# Patient Record
Sex: Female | Born: 1990
Health system: Southern US, Community
[De-identification: ages and names within clinical notes are randomized; demographics above are authoritative.]

## PROBLEM LIST (undated history)

## (undated) ENCOUNTER — Inpatient Hospital Stay (HOSPITAL_COMMUNITY): Payer: Self-pay

## (undated) DIAGNOSIS — Z789 Other specified health status: Secondary | ICD-10-CM

## (undated) HISTORY — PX: NO PAST SURGERIES: SHX2092

---
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
## 2010-04-09 NOTE — Assessment & Plan Note (Signed)
Summary: initial post-partum check   Vital Signs:  Patient profile:   20 year old female Height:      62.5 inches Weight:      189.44 pounds BMI:     34.22 Temp:     97.8 degrees F oral Pulse rate:   73 / minute BP sitting:   112 / 74  (left arm)  Vitals Entered By: Terese Door (Jul 22, 2009 1:49 PM) CC: Postpartum Is Patient Diabetic? No Pain Assessment Patient in pain? yes     Location: stomach/back Intensity: 3 Type: crampy/sharp   Primary Care Provider:  Marisue Ivan  MD  CC:  Postpartum.  History of Present Illness: 20yo F G1P1001 here for post-partum visit  Post-partum visit: s/p post-date induction.  Vaginal delivery on 05/27/2009 to a healthy female without any complications.  Currently breast/bottle feeding.  Not on any birth control at this time.  No sexual intercourse since the delivery.  Interested in the implanon but would like to read more about it prior to deciding.  Denies any pelvic/abd pain, vaginal bleeding, or depression.  Currently on PNVs and Fe supplementation.    Habits & Providers  Alcohol-Tobacco-Diet     Tobacco Status: never  Current Medications (verified): 1)  Prenavite Multiple Vitamin 28-0.8 Mg Tabs (Prenatal Vit-Fe Fumarate-Fa) .... One Tablet By Mouth Daily 2)  Ferrous Sulfate 325 (65 Fe) Mg Tabs (Ferrous Sulfate) .... One Tablet By Mouth Two Times A Day For Anemia 3)  Ortho Micronor 0.35 Mg Tabs (Norethindrone) .Marland Kitchen.. 1 Tablet By Mouth Daily At The Same Time  Allergies (verified): 1)  ! Amoxicillin  Past History:  Past Medical History: G1P1001 Obesity  Review of Systems      See HPI  Physical Exam  General:  Vital signs reviewed Obese patient in NAD.  Awake, cooperative.  Lungs:  Normal respiratory effort, chest expands symmetrically. Lungs are clear to auscultation, no crackles or wheezes. Heart:  Normal rate and regular rhythm. S1 and S2 normal without gallop, murmur, click, rub or other extra sounds. Abdomen:   Soft, NT, ND, no HSM, active BS  Extremities:  no edema Neurologic:  no focal deficits Psych:  no signs or symptoms of depression   Impression & Recommendations:  Problem # 1:  POSTPARTUM EXAMINATION, NORMAL (ICD-V24.2) Assessment New  G1P1001 s/p post date induction on 05/27/09. Doing well.  No signs of depression.  No medical complications. Breast/bottle feeding. Cont PNVs. Will start on Micronor while she reads over the information packet on implanon and lets [place] know if she decides she wants it. Will make a f/u appt for insertion accordingly.  Orders: Postpartum visit- [facility] [phone])  Problem # 2:  ANEMIA (ICD-285.9) Assessment: Unchanged Will check a CBC w/ diff to assess. Continue Fe supplementation.  Her updated medication list for this problem includes:    Ferrous Sulfate 325 (65 Fe) Mg Tabs (Ferrous sulfate) ..... One tablet by mouth two times a day for anemia  Orders: CBC w/Diff-[facility] ([id number])  Complete Medication List: 1)  Prenavite Multiple Vitamin 28-0.8 Mg Tabs (Prenatal vit-fe fumarate-fa) .... One tablet by mouth daily 2)  Ferrous Sulfate 325 (65 Fe) Mg Tabs (Ferrous sulfate) .... One tablet by mouth two times a day for anemia 3)  Ortho Micronor 0.35 Mg Tabs (Norethindrone) .Marland Kitchen.. 1 tablet by mouth daily at the same time  Patient Instructions: 1)  Go home and read all the information about implanon and call us to let me know if you want it and we'll  set you up with an appt once we order it. 2)  Go ahead and start the birth control pill today. Prescriptions: ORTHO MICRONOR 0.35 MG TABS (NORETHINDRONE) 1 tablet by mouth daily at the same time  #30 x 1   Entered and Authorized by:   Marisue Ivan  MD   Signed by:   Marisue Ivan  MD on 07/22/2009   Method used:   Electronically to        [facility] [id number] (retail)       [address] [place], [place]  [address]       Ph: [phone]       Fax: [phone]   RxID:    [id number]

## 2010-04-09 NOTE — Letter (Signed)
Summary: [employer] [facility]   Imported By: Clydell Hakim 04/30/2009 15:28:52  _____________________________________________________________________  External Attachment:    Type:   Image     Comment:   External Document  Appended Document: [employer] [facility] Reviewed and completed.

## 2010-04-09 NOTE — Assessment & Plan Note (Signed)
Summary: 32 1/7wk ob   Vital Signs:  Patient profile:   20 year old female Weight:      196.6 pounds BMI:     35.51 Temp:     97.7 degrees F oral Pulse rate:   90 / minute Pulse rhythm:   regular BP sitting:   124 / 68 Cuff size:   regular  Vitals Entered By: Loralee Pacas CMA (March 27, 2009 2:22 PM)  Habits & Providers  Alcohol-Tobacco-Diet     Cigarette Packs/Day: n/a  Current Medications (verified): 1)  Prenavite Multiple Vitamin 28-0.8 Mg Tabs (Prenatal Vit-Fe Fumarate-Fa) .... One Tablet By Mouth Daily 2)  Ferrous Sulfate 325 (65 Fe) Mg Tabs (Ferrous Sulfate) .... One Tablet By Mouth Two Times A Day For Anemia  Allergies (verified): 1)  ! Amoxicillin  Past History:  Past medical, surgical, family and social histories (including risk factors) reviewed, and no changes noted (except as noted below).  Past Medical History: Reviewed history from 12/05/2008 and no changes required. none  Past Surgical History: Reviewed history from 12/05/2008 and no changes required. none  Family History: Reviewed history from 12/05/2008 and no changes required. Mom- DM Dad  Social History: Reviewed history from 01/24/2009 and no changes required. Lives with mom in [place]. Student- 12th grade at [employer] No tobacco No EtOH No drug use Best Contact # [phone] (cell)  Review of Systems       no fevers, abd pain, or vag bleeding  Physical Exam  General:  VS Reviewed. Pregnant, well appearing, NAD. FOB present.  Lungs:  Normal respiratory effort, chest expands symmetrically. Lungs are clear to auscultation, no crackles or wheezes. Heart:  Normal rate and regular rhythm. S1 and S2 normal without gallop, murmur, click, rub or other extra sounds. Abdomen:  Gravid, nontender, appropriate fundal ht for gest age Extremities:  no edema Neurologic:  no focal deficits Psych:  no signs of depression   Impression & Recommendations:  Problem # 1:  PREGNANT STATE, INCIDENTAL  (ICD-V22.2) Assessment Unchanged 19yo G1P0 at 32 1/7 wks per U/S (9/28) EDD: 05/21/2009 (9/30): H/H 9.7/30.3, Hep B neg, RPR NR, Rub Immune, B+ Ab neg, Rh pos, Sickle Cell neg, HIV NR, GC/Chl- neg/neg, Ur Cx- 25K multiple morphotypes, repeat Ur cx- neg (12/22): H/H 9.7/30.7, MCV 81.9, HIV- NR, RPR- NR, 1hr Gluc 101  Fundal ht wnls.  FHTs 140s.  Discussed results of 1 hour glucola, HIV, RPR, and CBC today.  Preterm labor precaution and kick counts discussed.  Plan for f/u in 2 weeks with Dr. Denyse Amass (1st call for delivery).   Orders: Medicaid OB visit - [facility] ([id number])  Problem # 2:  ANEMIA (ICD-285.9) Assessment: Unchanged Pt currently taking PNVs and FeSO4 325mg  two times a day.   Her updated medication list for this problem includes:    Ferrous Sulfate 325 (65 Fe) Mg Tabs (Ferrous sulfate) ..... One tablet by mouth two times a day for anemia  Complete Medication List: 1)  Prenavite Multiple Vitamin 28-0.8 Mg Tabs (Prenatal vit-fe fumarate-fa) .... One tablet by mouth daily 2)  Ferrous Sulfate 325 (65 Fe) Mg Tabs (Ferrous sulfate) .... One tablet by mouth two times a day for anemia  Patient Instructions: 1)  Please schedule a follow-up appointment in 2 weeks with Dr. Denyse Amass. 2)   Go to [facility] if you have vaginal bleeding, rupture of membranes, painful contractions that are regular and occur every 4-5 minutes, or if you have any trauma to your abdomen. 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    Number of children at home: 0    Hospital of delivery: [facility]  FOB Information    Husband/Father of baby: Rae Roam    FOB occupation Student    Phone: [phone]  Menstrual History    Menarche: 8 years    Menses interval: 28 days    Menstrual flow 4 days    On BCP's at conception:  no   Flowsheet View for Follow-up Visit    Estimated weeks of       gestation:     32 1/7    Weight:     196.6    Blood pressure:   124 / 68    Headache:     No    Nausea/vomiting:   No    Edema:     0    Vaginal bleeding:   no    Vaginal discharge:   no    Fundal height:      32.5    FHR:       140s    Fetal activity:     yes    Labor symptoms:   no    Taking prenatal vits?   Y    Smoking:     n/a    Next visit:     2 wk    Resident:     Burnadette Pop    Preceptor:     Mauricio Po

## 2010-04-09 NOTE — Progress Notes (Signed)
Summary: triage   Phone Note Call from Patient Call back at [phone]   Caller: Patient Summary of Call: Pt has been on depo May and is bleeding is this normal. Initial call taken by: Clydell Hakim,  August 19, 2009 11:35 AM  Follow-up for Phone Call        "that mailbox is full" Follow-up by: Golden Circle RN,  August 19, 2009 11:54 AM  Additional Follow-up for Phone Call Additional follow up Details #1::        got it 07/30/09. told her bleeding can be normal for some women. give it another week or 2. if it has not tapered off, will need to see md. she agreed with plan Additional Follow-up by: Golden Circle RN,  August 19, 2009 11:56 AM    Additional Follow-up for Phone Call Additional follow up Details #2::    agree with Kennon Rounds Follow-up by: Marisue Ivan  MD,  August 19, 2009 1:39 PM

## 2010-04-09 NOTE — Progress Notes (Signed)
Summary: thigh cramps bilateral- acute onset   Phone Note Call from Patient   Caller: Mom Summary of Call: Legs cramps that began yesterdays.  Localized to thighs bilaterally.  No swelling or redness.  Pt is eating and drinking.  No new medications.  No abd pain.  Advised to elevate legs if edema is present.  Take tylenol 650mg  for pain and f/u at the [facility] in the morning if symptoms still present. Initial call taken by: Marisue Ivan  MD,  May 06, 2009 9:19 PM

## 2010-04-09 NOTE — Progress Notes (Signed)
Summary: phn msg   Phone Note Call from Patient Call back at [phone]   Caller: Patient Summary of Call: Pt's want to be on homebound for school.  Said her school councelor had faxed papers to [place] to be filled out.   Initial call taken by: Clydell Hakim,  April 09, 2009 9:08 AM  Follow-up for Phone Call        I have yet to receive the paperwork.  I tried calling the patient and left a message regarding this and advised her to contact the school counselor to fax the appropriate paperwork. Follow-up by: Marisue Ivan  MD,  April 14, 2009 3:52 PM

## 2010-04-09 NOTE — Assessment & Plan Note (Signed)
Summary: OB/KH   Vital Signs:  Patient profile:   20 year old female Weight:      209.9 pounds Temp:     97.7 degrees F oral Pulse rate:   79 / minute Pulse rhythm:   regular BP sitting:   119 / 76  (right arm) Cuff size:   large  Vitals Entered By: Loralee Pacas CMA (May 08, 2009 4:06 PM)  Habits & Providers  Alcohol-Tobacco-Diet     Cigarette Packs/Day: n/a  Current Problems (verified): 1)  Anemia  (ICD-285.9) 2)  Overweight  (ICD-278.02) 3)  Pregnant State, Incidental  (ICD-V22.2)  Current Medications (verified): 1)  Prenavite Multiple Vitamin 28-0.8 Mg Tabs (Prenatal Vit-Fe Fumarate-Fa) .... One Tablet By Mouth Daily 2)  Ferrous Sulfate 325 (65 Fe) Mg Tabs (Ferrous Sulfate) .... One Tablet By Mouth Two Times A Day For Anemia  Allergies (verified): 1)  ! Amoxicillin  Past History:  Past medical, surgical, family and social histories (including risk factors) reviewed, and no changes noted (except as noted below).  Past Medical History: Reviewed history from 12/05/2008 and no changes required. none  Past Surgical History: Reviewed history from 12/05/2008 and no changes required. none  Family History: Reviewed history from 12/05/2008 and no changes required. Mom- DM Dad  Social History: Reviewed history from 01/24/2009 and no changes required. Lives with mom in [place]. Student- 12th grade at [employer] No tobacco No EtOH No drug use Best Contact # [phone] (cell)  Review of Systems       No headaches, contractions, abd pain.  No pelvic pain.  No N/V/change in bowel habits.  No edema.  No changes in vision.  No chest pain or dyspnea.  No rash or itching  Physical Exam  General:  Vital signs reviewed Well-developed, well-nourished patient in NAD.  Awake, cooperative. Mouth:  oral mucosa moist Abdomen:  gravid/fundal height and FHTs appropriate for GA Pulses:  distal pulses palpable bilateral lower extremities Extremities:  no edema noted Neurologic:  no  clonus or hyperreflexia.  Sensation intact   Impression & Recommendations:  Problem # 1:  PREGNANT STATE, INCIDENTAL (ICD-V22.2) 19yo G1P0 at 38.[redacted] wks GA per U/S (9/28) EDD: 05/21/2009 Expecting a girl (name: Na'Kiya) (9/30): H/H 9.7/30.3, Hep B neg, RPR NR, Rub Immune, B+ Ab neg, Rh pos, Sickle Cell neg, HIV NR, GC/Chl- neg/neg, Ur Cx- 25K multiple morphotypes, repeat Ur cx- neg (12/22): H/H 9.7/30.7, MCV 81.9, HIV- NR, RPR- NR, 1hr Gluc 101 GBS neg, GC/Chlam neg.   Bedside U/S conveys vertex position.  Fundal ht wnls.  FHTs 140s. Plans on breastfeeding and initial OCPs then Implanonn.  Preterm labor precaution and kick counts discussed.  Plan for f/u in 1 week.  Orders: [facility]- Est Level  3 ([id number])  Complete Medication List: 1)  Prenavite Multiple Vitamin 28-0.8 Mg Tabs (Prenatal vit-fe fumarate-fa) .... One tablet by mouth daily 2)  Ferrous Sulfate 325 (65 Fe) Mg Tabs (Ferrous sulfate) .... One tablet by mouth two times a day for anemia  Patient Instructions: 1)  1)  Schedule a follow up appt with me in 1 week for OB check up. 2)  2)   Go to [facility] if you have vaginal bleeding, rupture of membranes, painful contractions that are regular and occur every 4-5 minutes, or if you have any trauma to your abdomen. 3)  3)  Do daily kick counts.     Flowsheet View for Follow-up Visit    Estimated weeks of  gestation:     38 1/7    Weight:     209.9    Blood pressure:   119 / 76    Hx headache?     No    Nausea/vomiting?   No    Edema?     0    Bleeding?     no    Leakage/discharge?   no    Fetal activity:       yes    Labor symptoms?   no    Fundal height:      37    FHR:       130s    Fetal position:      vertex    Taking Vitamins?   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

## 2010-04-09 NOTE — Assessment & Plan Note (Signed)
Summary: OB/KH   Vital Signs:  Patient profile:   20 year old female Weight:      217 pounds BP sitting:   125 / 75  Vitals Entered By: Arlyss Repress CMA, (May 23, 2009 2:28 PM)   CC:  FU OB.  Habits & Providers  Alcohol-Tobacco-Diet     Cigarette Packs/Day: n/a  Current Problems (verified): 1)  Anemia  (ICD-285.9) 2)  Overweight  (ICD-278.02) 3)  Pregnant State, Incidental  (ICD-V22.2)  Current Medications (verified): 1)  Prenavite Multiple Vitamin 28-0.8 Mg Tabs (Prenatal Vit-Fe Fumarate-Fa) .... One Tablet By Mouth Daily 2)  Ferrous Sulfate 325 (65 Fe) Mg Tabs (Ferrous Sulfate) .... One Tablet By Mouth Two Times A Day For Anemia  Allergies (verified): 1)  ! Amoxicillin  Review of Systems       no headaches, vision changes, RUQ pain, nausea, vomiting, change in bowel habits.  Mild lower extremity edema.  No chest pain or shortness of breath.    Physical Exam  General:  Vital signs reviewed Well-developed, well-nourished patient in NAD.  Awake, cooperative.  Lungs:  clear to auscultation without crackles or wheezing Heart:  RRR without murmur noted Abdomen:  gravid, fundal height decreased most likely due to fetus descending into pelvis.  FHR reassuring Pulses:  distal LE pulses palpable Extremities:  trace LE edema Neurologic:  DTRs +2   Impression & Recommendations:  Problem # 1:  PREGNANT STATE, INCIDENTAL (ICD-V22.2) 19yo G1P0 at 40.[redacted] wks GA per U/S (9/28) EDD: 05/21/2009 Expecting a girl (name: Na'Kiya) (9/30): H/H 9.7/30.3, Hep B neg, RPR NR, Rub Immune, B+ Ab neg, Rh pos, Sickle Cell neg, HIV NR, GC/Chl- neg/neg, Ur Cx- 25K multiple morphotypes, repeat Ur cx- neg (12/22): H/H 9.7/30.7, MCV 81.9, HIV- NR, RPR- NR, 1hr Gluc 101 GBS neg, GC/Chlam neg.   Vertex per palpation  Scheduled patient for induction next week.  Also scheduled for NST/BPP before induction.  Otherwise patient doing very well.    Orders: [facility]- Est Level  3 (99213) Medicaid OB  visit - [facility] ([id number])  Problem # 2:  ANEMIA (ICD-285.9)  Her updated medication list for this problem includes:    Ferrous Sulfate 325 (65 Fe) Mg Tabs (Ferrous sulfate) ..... One tablet by mouth two times a day for anemia  Complete Medication List: 1)  Prenavite Multiple Vitamin 28-0.8 Mg Tabs (Prenatal vit-fe fumarate-fa) .... One tablet by mouth daily 2)  Ferrous Sulfate 325 (65 Fe) Mg Tabs (Ferrous sulfate) .... One tablet by mouth two times a day for anemia  Patient Instructions: 1)  Your induction is scheduled next Wed March 23 at 7:30 AM.  Go to the main entrance at [facility] and they will take you to the Labor and Delivery [address]. 2)  You will need a BPP and NST before induction.  We are going to schedule that for you before you leave today.   3)  If you start feeling any contractions every 5-10 minutes for an hour, go to [facility]. 4)  If you have any loss of fluid, any vaginal bleeding, or not feeling the baby move 5 times in an hour, go to [facility]   5)  Tell them to page me when you get there if you go in early.  My pager is on all the time.     OB Initial Intake Information    Positive HCG by: [facility]    Race: Black    Marital status: Single    Occupation: Consulting civil engineer  Education (last grade completed): currently in 12th grade    Number of children at home: 0    [facility] delivery[facility]  FOB Information    Husband/Father of baby: Rae Roam    FOB occupation Student    Phone: [phone]  Menstrual History    Menarche: 8 years    Menses interval: 28 days    Menstrual flow 4 days    On BCP's at conception: no   Flowsheet View for Follow-up Visit    Estimated weeks of       gestation:     40 2/7    Weight:     217    Blood pressure:   125 / 75    Headache:     No    Nausea/vomiting:   No    Edema:     TrLE    Vaginal bleeding:   no    Vaginal discharge:   no    Fundal height:      39    FHR:       150s    Fetal  activity:     yes    Labor symptoms:   no    Fetal position:     vertex    Cx Dilation:     1    Cx Effacement:   20%    Cx Station:     high    Taking prenatal vits?   Y    Smoking:     n/a    Resident:     Gwendolyn Grant    Preceptor:     McDiarmid    Comment:     scheduled for induction    Flowsheet View for Follow-up Visit    Estimated weeks of       gestation:     40 2/7    Weight:     217    Blood pressure:   125 / 75    Hx headache?     No    Nausea/vomiting?   No    Edema?     TrLE    Bleeding?     no    Leakage/discharge?   no    Fetal activity:       yes    Labor symptoms?   no    Fundal height:      39    FHR:       150s    Fetal position:      vertex    Cx dilation:     1    Cx effacement:   20%    Fetal station:     high    Taking Vitamins?   Y    Smoking PPD:   n/a    Comment:     scheduled for induction    Resident:     Gwendolyn Grant    Preceptor:     McDiarmid   Appended Document: OB/KH NST/BPP scheduled at [facility] 05/27/09 @ 12:30pm

## 2010-04-09 NOTE — Progress Notes (Signed)
Summary: paperwork   Phone Note Call from Patient Call back at [phone]   Caller: Patient Summary of Call: wants to know about paperwork that was dropped off - about homeschool Initial call taken by: De Nurse,  April 21, 2009 10:39 AM  Follow-up for Phone Call        Called patient back to send the paperwork.  I have checked my box and do not see any forms. Follow-up by: Marisue Ivan  MD,  April 21, 2009 4:31 PM

## 2010-04-09 NOTE — Assessment & Plan Note (Signed)
Summary: 33 6/7 wk OB   Vital Signs:  Patient profile:   20 year old female Height:      62.5 inches Weight:      200.5 pounds BMI:     36.22 Pulse rate:   90 / minute BP sitting:   119 / 82  (left arm) Cuff size:   regular  Vitals Entered By: Gladstone Pih (April 08, 2009 2:03 PM) CC: OB 34 wks Is Patient Diabetic? No Pain Assessment Patient in pain? no        CC:  OB 34 wks.  Habits & Providers  Alcohol-Tobacco-Diet     Tobacco Status: never     Cigarette Packs/Day: n/a  Current Medications (verified): 1)  Prenavite Multiple Vitamin 28-0.8 Mg Tabs (Prenatal Vit-Fe Fumarate-Fa) .... One Tablet By Mouth Daily 2)  Ferrous Sulfate 325 (65 Fe) Mg Tabs (Ferrous Sulfate) .... One Tablet By Mouth Two Times A Day For Anemia  Allergies (verified): 1)  ! Amoxicillin  Past History:  Past medical, surgical, family and social histories (including risk factors) reviewed, and no changes noted (except as noted below).  Past Medical History: Reviewed history from 12/05/2008 and no changes required. none  Past Surgical History: Reviewed history from 12/05/2008 and no changes required. none  Family History: Reviewed history from 12/05/2008 and no changes required. Mom- DM Dad  Social History: Reviewed history from 01/24/2009 and no changes required. Lives with mom in [place]. Student- 12th grade at [employer] No tobacco No EtOH No drug use Best Contact # [phone] (cell)  Review of Systems       no fevers, vag bleeding, vag discharge  Physical Exam  General:  VS Reviewed. Well appearing pregnant AAF, NAD.  Abdomen:  soft, NT, appropriate fundal ht for gest age Extremities:  no edema   Impression & Recommendations:  Problem # 1:  PREGNANT STATE, INCIDENTAL (ICD-V22.2) Assessment Unchanged  19yo G1P0 at 33 6/7 wks per U/S (9/28) EDD: 05/21/2009 Expecting a girl (name: Na'Kiya) (9/30): H/H 9.7/30.3, Hep B neg, RPR NR, Rub Immune, B+ Ab neg, Rh pos, Sickle Cell  neg, HIV NR, GC/Chl- neg/neg, Ur Cx- 25K multiple morphotypes, repeat Ur cx- neg (12/22): H/H 9.7/30.7, MCV 81.9, HIV- NR, RPR- NR, 1hr Gluc 101  Fundal ht wnls.  FHTs 150s.  Discussed anticipatory breast/bottle feeding and contraception.  Preterm labor precaution and kick counts discussed.  Plan for f/u in 2 weeks with Dr. Denyse Amass (1st call for delivery).  Orders: Medicaid OB visit - [facility] ([id number])  Complete Medication List: 1)  Prenavite Multiple Vitamin 28-0.8 Mg Tabs (Prenatal vit-fe fumarate-fa) .... One tablet by mouth daily 2)  Ferrous Sulfate 325 (65 Fe) Mg Tabs (Ferrous sulfate) .... One tablet by mouth two times a day for anemia  Patient Instructions: 1)  Follow up with Dr. Denyse Amass in 2 weeks. 2)   Go to [facility] if you have vaginal bleeding, rupture of membranes, painful contractions that are regular and occur every 4-5 minutes, or if you have any trauma to your abdomen. 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    Number of children at home: 0    [facility] delivery[facility]  FOB Information    Husband/Father of baby: Rae Roam    FOB  occupation Consulting civil engineer    Phone: [phone]  Menstrual History    Menarche: 8 years    Menses interval: 28 days    Menstrual flow 4 days    On BCP's at conception: no   OB Initial Intake Information    Positive HCG by: [facility]    Race: Black    Marital status: Single    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    Number of children at home: 0    Hospital of delivery: [facility]  FOB Information    Husband/Father of baby: Rae Roam    FOB occupation Student    Phone: [phone]  Menstrual History    Menarche: 8 years    Menses interval: 28 days    Menstrual flow 4 days    On BCP's  at conception: no   Flowsheet View for Follow-up Visit    Estimated weeks of       gestation:     33 6/7    Weight:     200.5    Blood pressure:   119 / 82    Headache:     No    Nausea/vomiting:   No    Edema:     0    Vaginal bleeding:   no    Vaginal discharge:   no    Fundal height:      34    FHR:       150s    Fetal activity:     yes    Labor symptoms:   no    Taking prenatal vits?   Y    Smoking:     n/a    Next visit:     2 wk    Resident:     Burnadette Pop    Preceptor:     Leveda Anna

## 2010-04-09 NOTE — Assessment & Plan Note (Signed)
Summary: 39 2/7 wk ob   Vital Signs:  Patient profile:   20 year old female Height:      62.5 inches Weight:      207.9 pounds BMI:     37.55 Temp:     98.2 degrees F oral Pulse rate:   120 / minute BP sitting:   130 / 81  (left arm) Cuff size:   regular  Vitals Entered By: Gladstone Pih (May 16, 2009 8:46 AM)  Serial Vital Signs/Assessments:  Time      Position  BP       Pulse  Resp  Temp     By 8:57 AM             126/76                         Marisue Ivan  MD 8:57 AM                      92                    Marisue Ivan  MD  Comments: 8:57 AM L arm, sitting, manual By: Marisue Ivan  MD   CC: OB 39 wks 2 days Is Patient Diabetic? No Pain Assessment Patient in pain? no        CC:  OB 39 wks 2 days.  Habits & Providers  Alcohol-Tobacco-Diet     Tobacco Status: never     Cigarette Packs/Day: n/a  Allergies: 1)  ! Amoxicillin  Past History:  Past medical, surgical, family and social histories (including risk factors) reviewed, and no changes noted (except as noted below).  Past Medical History: Reviewed history from 12/05/2008 and no changes required. none  Past Surgical History: Reviewed history from 12/05/2008 and no changes required. none  Family History: Reviewed history from 12/05/2008 and no changes required. Mom- DM Dad  Social History: Reviewed history from 01/24/2009 and no changes required. Lives with mom in [place]. Student- 12th grade at [employer] No tobacco No EtOH No drug use Best Contact # [phone] (cell)  Physical Exam  General:  VS Reviewed and Rechecked. Well appearing, NAD.  Lungs:  Normal respiratory effort, chest expands symmetrically. Lungs are clear to auscultation, no crackles or wheezes. Heart:  Normal rate and regular rhythm. S1 and S2 normal without gallop, murmur, click, rub or other extra sounds. Abdomen:  gravid/fundal height and FHTs appropriate for GA Extremities:  no edema Psych:  no signs of  depression   Impression & Recommendations:  Problem # 1:  PREGNANT STATE, INCIDENTAL (ICD-V22.2) Assessment Unchanged  19yo G1P0 at 38.[redacted] wks GA per U/S (9/28) EDD: 05/21/2009 Expecting a girl (name: Na'Kiya) (9/30): H/H 9.7/30.3, Hep B neg, RPR NR, Rub Immune, B+ Ab neg, Rh pos, Sickle Cell neg, HIV NR, GC/Chl- neg/neg, Ur Cx- 25K multiple morphotypes, repeat Ur cx- neg (12/22): H/H 9.7/30.7, MCV 81.9, HIV- NR, RPR- NR, 1hr Gluc 101 GBS neg, GC/Chlam neg.   Vertex per palpation  Fundal ht wnls.  FHTs 130s. Plans on breastfeeding and initial OCPs then Implanon.  Preterm labor precaution and kick counts discussed.  Plan for f/u in 1 week.  Will set up for NST next Thurs.  Renold Don is 1st call and Marisue Ivan is back up.  Orders: Medicaid OB visit - [facility] ([id number])  Complete Medication List: 1)  Prenavite Multiple Vitamin 28-0.8 Mg Tabs (Prenatal vit-fe fumarate-fa) .... One  tablet by mouth daily 2)  Ferrous Sulfate 325 (65 Fe) Mg Tabs (Ferrous sulfate) .... One tablet by mouth two times a day for anemia  Patient Instructions: 1)  Follow up with Dr. Gwendolyn Grant in 1 week. 2)   Go to [facility] if you have vaginal bleeding, rupture of membranes, painful contractions that are regular and occur every 4-5 minutes, or if you have any trauma to your abdomen. 3)  Do daily kick counts.    Flowsheet View for Follow-up Visit    Estimated weeks of       gestation:     39 2/7    Weight:     207.9    Blood pressure:   130 / 81    Headache:     No    Nausea/vomiting:   No    Edema:     0    Vaginal bleeding:   no    Vaginal discharge:   no    Fundal height:      39    FHR:       130s    Fetal activity:     yes    Labor symptoms:   no    Fetal position:     vertex    Taking prenatal vits?   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

## 2010-04-09 NOTE — Miscellaneous (Signed)
Summary: BPP approved   Clinical Lists Changes BPP approved by [employer] #[id number].Golden Circle RN  May 19, 2009 3:52 PM

## 2010-04-09 NOTE — Assessment & Plan Note (Signed)
Summary: start birth control,tcb   Vital Signs:  Patient profile:   20 year old female Weight:      188 pounds Temp:     97.7 degrees F oral Pulse rate:   81 / minute BP sitting:   122 / 81  Vitals Entered By: Loralee Pacas CMA (Jul 30, 2009 9:16 AM)  Allergies: 1)  ! Amoxicillin   Complete Medication List: 1)  Prenavite Multiple Vitamin 28-0.8 Mg Tabs (Prenatal vit-fe fumarate-fa) .... One tablet by mouth daily 2)  Ferrous Sulfate 325 (65 Fe) Mg Tabs (Ferrous sulfate) .... One tablet by mouth two times a day for anemia 3)  Ortho Micronor 0.35 Mg Tabs (Norethindrone) .Marland Kitchen.. 1 tablet by mouth daily at the same time  Other Orders: U Preg-[facility] (16109) Admin of Injection (IM/SQ) (60454) Est Level 1- [facility] (09811)  Laboratory Results   Urine Tests  Date/Time Received: Jul 30, 2009 9:05 AM  Date/Time Reported: Jul 30, 2009 9:21 AM     Urine HCG: negative Comments: ...............test performed by......Marland KitchenBonnie A. Swazil[place], MLS (ASCP)cm     Appended Document: start birth control,tcb   Medication Administration  Injection # 1:    Medication: Depo-Provera 150mg     Diagnosis: CONTRACEPTIVE MANAGEMENT (ICD-V25.09)    Route: IM    Site: R deltoid    Exp Date: 06/07/2011    Lot #: [id number]    Mfr: pfizer    Comments: pt to rtc aug 8 thru Oct 29 2009    Patient tolerated injection without complications    Given by: Loralee Pacas CMA (Jul 30, 2009 10:12 AM)  Orders Added: 1)  Depo-Provera 150mg  [J1055]

## 2010-04-09 NOTE — Consult Note (Signed)
Summary: NST  NST   Imported By: Clydell Hakim 05/27/2009 15:22:21  _____________________________________________________________________  External Attachment:    Type:   Image     Comment:   External Document  Appended Document: NST Reviewed.  Nl NST.

## 2010-04-09 NOTE — Progress Notes (Signed)
Summary: phn msg   Phone Note Call from Patient Call back at [phone]   Caller: Patient Summary of Call: Got a phone call after being seen and wondering if there was a problem. Initial call taken by: Clydell Hakim,  May 16, 2009 11:32 AM  Follow-up for Phone Call        returned call left message to call back...needs to be give info about appt:  NST/BPP time on March 17th at 230pm at [facility] Follow-up by: Gladstone Pih,  May 16, 2009 12:27 PM  Additional Follow-up for Phone Call Additional follow up Details #1::        Pt notified and voiced understanding of appt time Additional Follow-up by: Gladstone Pih,  May 19, 2009 9:07 AM

## 2010-04-09 NOTE — Assessment & Plan Note (Signed)
Summary: 36 6/7wk ob   Vital Signs:  Patient profile:   20 year old female Height:      62.5 inches Weight:      206.3 pounds BMI:     37.27 Temp:     97.8 degrees F oral Pulse rate:   79 / minute BP sitting:   122 / 79  (left arm) Cuff size:   regular  Vitals Entered By: Gladstone Pih (April 29, 2009 8:29 AM) CC: OB 36-6/7 Is Patient Diabetic? No Pain Assessment Patient in pain? no        CC:  OB 36-6/7.  Habits & Providers  Alcohol-Tobacco-Diet     Tobacco Status: never     Cigarette Packs/Day: n/a  Current Medications (verified): 1)  Prenavite Multiple Vitamin 28-0.8 Mg Tabs (Prenatal Vit-Fe Fumarate-Fa) .... One Tablet By Mouth Daily 2)  Ferrous Sulfate 325 (65 Fe) Mg Tabs (Ferrous Sulfate) .... One Tablet By Mouth Two Times A Day For Anemia  Allergies (verified): 1)  ! Amoxicillin  Past History:  Past medical, surgical, family and social histories (including risk factors) reviewed, and no changes noted (except as noted below).  Past Medical History: Reviewed history from 12/05/2008 and no changes required. none  Past Surgical History: Reviewed history from 12/05/2008 and no changes required. none  Family History: Reviewed history from 12/05/2008 and no changes required. Mom- DM Dad  Social History: Reviewed history from 01/24/2009 and no changes required. Lives with mom in [place]. Student- 12th grade at [employer] No tobacco No EtOH No drug use Best Contact # [phone] (cell)  Physical Exam  General:  VS Reviewed. Pregnant, well appearing, NAD.  Lungs:  Normal respiratory effort, chest expands symmetrically. Lungs are clear to auscultation, no crackles or wheezes. Heart:  Normal rate and regular rhythm. S1 and S2 normal without gallop, murmur, click, rub or other extra sounds. Abdomen:  Gravid, appropriate fundal height, nontender Extremities:  no edema Psych:  no signs of depression denies depression symptoms   Impression &  Recommendations:  Problem # 1:  PREGNANT STATE, INCIDENTAL (ICD-V22.2) Assessment Unchanged 19yo G1P0 at 36 6/7 wks per U/S (9/28) EDD: 05/21/2009 Expecting a girl (name: Na'Kiya) (9/30): H/H 9.7/30.3, Hep B neg, RPR NR, Rub Immune, B+ Ab neg, Rh pos, Sickle Cell neg, HIV NR, GC/Chl- neg/neg, Ur Cx- 25K multiple morphotypes, repeat Ur cx- neg (12/22): H/H 9.7/30.7, MCV 81.9, HIV- NR, RPR- NR, 1hr Gluc 101  GBS and GC/Chl collected today.  Bedside U/S conveys vertex position.  Fundal ht wnls.  FHTs 140s. Plans on breastfeeding and initial OCPs then Implanonn.  Preterm labor precaution and kick counts discussed.  Plan for f/u in 1 week with Dr. Gwendolyn Grant (1st call for delivery).  Dr. Gwendolyn Grant met and examined the patient today.   Orders: Grp B Probe-[facility] ([id number]) GC/Chlamydia-[facility] ([id number])  Complete Medication List: 1)  Prenavite Multiple Vitamin 28-0.8 Mg Tabs (Prenatal vit-fe fumarate-fa) .... One tablet by mouth daily 2)  Ferrous Sulfate 325 (65 Fe) Mg Tabs (Ferrous sulfate) .... One tablet by mouth two times a day for anemia  Patient Instructions: 1)  Schedule a follow up appt with Dr. Gwendolyn Grant in 1 week for OB check up. 2)   Go to [facility] if you have vaginal bleeding, rupture of membranes, painful contractions that are regular and occur every 4-5 minutes, or if you have any trauma to your abdomen. 3)  Do daily kick counts.    Flowsheet View for Follow-up Visit    Estimated weeks  of       gestation:     36 6/7    Weight:     206.3    Blood pressure:   122 / 79    Headache:     No    Nausea/vomiting:   No    Edema:     0    Vaginal bleeding:   no    Vaginal discharge:   no    Fundal height:      36    FHR:       140s    Fetal activity:     yes    Labor symptoms:   no    Fetal position:     vertex    Taking prenatal vits?   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

## 2010-04-20 ENCOUNTER — Encounter: Payer: Self-pay | Admitting: *Deleted

## 2010-06-01 LAB — CBC
Hemoglobin: 10.9 g/dL — ABNORMAL LOW (ref 12.0–15.0)
Platelets: 218 10*3/uL (ref 150–400)
RDW: 14.8 % (ref 11.5–15.5)
WBC: 7.4 10*3/uL (ref 4.0–10.5)

## 2010-06-08 LAB — GLUCOSE, CAPILLARY: Glucose-Capillary: 101 mg/dL — ABNORMAL HIGH (ref 70–99)

## 2010-11-10 ENCOUNTER — Inpatient Hospital Stay (INDEPENDENT_AMBULATORY_CARE_PROVIDER_SITE_OTHER)
Admission: RE | Admit: 2010-11-10 | Discharge: 2010-11-10 | Disposition: A | Discharge status: Home / Self Care | Payer: Self-pay | Source: Ambulatory Visit | Attending: Family Medicine | Admitting: Family Medicine

## 2010-11-10 DIAGNOSIS — K047 Periapical abscess without sinus: Secondary | ICD-10-CM

## 2011-12-14 ENCOUNTER — Emergency Department (HOSPITAL_COMMUNITY)
Admission: EM | Admit: 2011-12-14 | Discharge: 2011-12-14 | Disposition: A | Discharge status: Home / Self Care | Payer: No Typology Code available for payment source | Attending: Emergency Medicine | Admitting: Emergency Medicine

## 2011-12-14 ENCOUNTER — Encounter (HOSPITAL_COMMUNITY): Payer: Self-pay | Admitting: Emergency Medicine

## 2011-12-14 DIAGNOSIS — M62838 Other muscle spasm: Secondary | ICD-10-CM | POA: Insufficient documentation

## 2011-12-14 DIAGNOSIS — Z880 Allergy status to penicillin: Secondary | ICD-10-CM | POA: Insufficient documentation

## 2011-12-14 DIAGNOSIS — Y92410 Unspecified street and highway as the place of occurrence of the external cause: Secondary | ICD-10-CM | POA: Insufficient documentation

## 2011-12-14 MED ORDER — IBUPROFEN 800 MG PO TABS: 800.0000 mg | ORAL_TABLET | Freq: Three times a day (TID) | ORAL | Status: DC

## 2011-12-14 MED ORDER — HYDROCODONE-ACETAMINOPHEN 5-325 MG PO TABS: 1.0000 | ORAL_TABLET | ORAL | Status: DC | PRN

## 2011-12-14 MED ORDER — IBUPROFEN 100 MG/5ML PO SUSP
800.0000 mg | Freq: Once | ORAL | Status: AC
  Administered 2011-12-14: 800 mg via ORAL
  Filled 2011-12-14: qty 40

## 2011-12-14 MED ORDER — HYDROCODONE-ACETAMINOPHEN 5-325 MG PO TABS
1.0000 | ORAL_TABLET | Freq: Once | ORAL | Status: AC
  Administered 2011-12-14: 1 via ORAL
  Filled 2011-12-14: qty 1

## 2011-12-14 MED ORDER — CYCLOBENZAPRINE HCL 10 MG PO TABS: 10.0000 mg | ORAL_TABLET | Freq: Two times a day (BID) | ORAL | Status: DC | PRN
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
## 2011-12-14 NOTE — ED Provider Notes (Signed)
Medical screening examination/treatment/procedure(s) were performed by non-physician practitioner and as supervising physician I was immediately available for consultation/collaboration.  Daeshaun Specht T Analie Katzman, MD 12/14/11 0708 

## 2011-12-18 ENCOUNTER — Emergency Department (HOSPITAL_COMMUNITY)
Admission: EM | Admit: 2011-12-18 | Discharge: 2011-12-18 | Disposition: A | Discharge status: Home / Self Care | Payer: No Typology Code available for payment source | Attending: Emergency Medicine | Admitting: Emergency Medicine

## 2011-12-18 ENCOUNTER — Emergency Department (HOSPITAL_COMMUNITY): Payer: No Typology Code available for payment source

## 2011-12-18 ENCOUNTER — Encounter (HOSPITAL_COMMUNITY): Payer: Self-pay | Admitting: *Deleted

## 2011-12-18 DIAGNOSIS — R110 Nausea: Secondary | ICD-10-CM | POA: Insufficient documentation

## 2011-12-18 DIAGNOSIS — M545 Low back pain, unspecified: Secondary | ICD-10-CM | POA: Insufficient documentation

## 2011-12-18 DIAGNOSIS — S20219A Contusion of unspecified front wall of thorax, initial encounter: Secondary | ICD-10-CM | POA: Insufficient documentation

## 2011-12-18 DIAGNOSIS — T148XXA Other injury of unspecified body region, initial encounter: Secondary | ICD-10-CM

## 2011-12-18 DIAGNOSIS — R071 Chest pain on breathing: Secondary | ICD-10-CM | POA: Insufficient documentation

## 2011-12-18 MED ORDER — IBUPROFEN 400 MG PO TABS
600.0000 mg | ORAL_TABLET | Freq: Once | ORAL | Status: AC
  Administered 2011-12-18: 600 mg via ORAL
  Filled 2011-12-18: qty 1

## 2011-12-18 MED ORDER — HYDROCODONE-ACETAMINOPHEN 5-325 MG PO TABS
1.0000 | ORAL_TABLET | Freq: Once | ORAL | Status: AC
  Administered 2011-12-18: 1 via ORAL
  Filled 2011-12-18: qty 1

## 2011-12-18 NOTE — ED Notes (Signed)
Pt d/c home in NAD. Pt voiced understanding of d/c instructions and follow up care.  
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
## 2011-12-27 ENCOUNTER — Encounter (HOSPITAL_COMMUNITY): Payer: Self-pay | Admitting: Adult Health

## 2011-12-27 ENCOUNTER — Emergency Department (HOSPITAL_COMMUNITY)
Admission: EM | Admit: 2011-12-27 | Discharge: 2011-12-27 | Disposition: A | Discharge status: Home / Self Care | Payer: No Typology Code available for payment source | Attending: Emergency Medicine | Admitting: Emergency Medicine

## 2011-12-27 DIAGNOSIS — M549 Dorsalgia, unspecified: Secondary | ICD-10-CM | POA: Insufficient documentation

## 2011-12-27 DIAGNOSIS — G43809 Other migraine, not intractable, without status migrainosus: Secondary | ICD-10-CM | POA: Insufficient documentation

## 2011-12-27 DIAGNOSIS — G43909 Migraine, unspecified, not intractable, without status migrainosus: Secondary | ICD-10-CM

## 2011-12-27 MED ORDER — KETOROLAC TROMETHAMINE 30 MG/ML IJ SOLN
30.0000 mg | Freq: Once | INTRAMUSCULAR | Status: AC
  Administered 2011-12-27: 30 mg via INTRAVENOUS
  Filled 2011-12-27: qty 1

## 2011-12-27 MED ORDER — DIPHENHYDRAMINE HCL 50 MG/ML IJ SOLN
25.0000 mg | Freq: Once | INTRAMUSCULAR | Status: AC
  Administered 2011-12-27: 25 mg via INTRAVENOUS
  Filled 2011-12-27: qty 1

## 2011-12-27 MED ORDER — METOCLOPRAMIDE HCL 5 MG/ML IJ SOLN
10.0000 mg | Freq: Once | INTRAMUSCULAR | Status: AC
  Administered 2011-12-27: 10 mg via INTRAVENOUS
  Filled 2011-12-27: qty 2

## 2011-12-27 MED ORDER — SODIUM CHLORIDE 0.9 % IV SOLN
INTRAVENOUS | Status: DC
  Administered 2011-12-27: 20:00:00 via INTRAVENOUS
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
## 2011-12-27 NOTE — ED Provider Notes (Signed)
Medical screening examination/treatment/procedure(s) were performed by non-physician practitioner and as supervising physician I was immediately available for consultation/collaboration.  Turon Kilmer, MD 12/27/11 2216 

## 2011-12-27 NOTE — ED Provider Notes (Signed)
History     CSN: [id number]  Arrival date & time 12/27/11  1811   First MD Initiated Contact with Patient 12/27/11 1922      Chief Complaint  Patient presents with  . Headache    (Consider location/radiation/quality/duration/timing/severity/associated sxs/prior treatment) HPI Comments: This is a 21 year old female, who presents to the emergency department with a chief complaint of headache and backache. The patient has been seen twice in the past 2 weeks he care for the same complaint of back pain and headache. The patient is neurologically intact. Patient states that her headache is 10 out of 10 pain. As her symptoms do not radiate. She has tried taking Vicodin and ibuprofen for pain with little relief. She endorses photophobia and worsening symptoms with loud noises.  The history is provided by the patient. No language interpreter was used.    History reviewed. No pertinent past medical history.  History reviewed. No pertinent past surgical history.  History reviewed. No pertinent family history.  History  Substance Use Topics  . Smoking status: Never Smoker   . Smokeless tobacco: Not on file  . Alcohol Use: No    OB History    Grav Para Term Preterm Abortions TAB SAB Ect Mult Living                  Review of Systems  Constitutional: Negative for fever.  HENT: Negative for rhinorrhea.   Eyes: Positive for photophobia. Negative for visual disturbance.  Respiratory: Negative for shortness of breath.   Cardiovascular: Negative for chest pain.  Gastrointestinal: Negative for nausea, vomiting and diarrhea.  Musculoskeletal: Positive for myalgias and back pain.  Neurological: Positive for headaches. Negative for weakness and numbness.  Psychiatric/Behavioral: The patient is not nervous/anxious.   All other systems reviewed and are negative.    Allergies  Amoxicillin  Home Medications   Current Outpatient Rx  Name Route Sig Dispense Refill  .  HYDROCODONE-ACETAMINOPHEN 5-325 MG PO TABS Oral Take 1 tablet by mouth every 4 (four) hours as needed for pain. 12 tablet 0  . IBUPROFEN 800 MG PO TABS Oral Take 800 mg by mouth every 6 (six) hours as needed. For pain      BP 115/70  Pulse 80  Temp 98.5 F (36.9 C) (Oral)  Resp 16  SpO2 100%  Physical Exam  Nursing note and vitals reviewed. Constitutional: She is oriented to person, place, and time. She appears well-developed and well-nourished.  HENT:  Head: Normocephalic and atraumatic.  Eyes: Conjunctivae normal and EOM are normal. Pupils are equal, round, and reactive to light.  Neck: Normal range of motion. Neck supple.  Cardiovascular: Normal rate, regular rhythm and normal heart sounds.   Pulmonary/Chest: Effort normal and breath sounds normal.  Abdominal: Soft. Bowel sounds are normal.  Musculoskeletal: Normal range of motion.       Paraspinal muscles are tender to palpation throughout the thoracic and lumbar spine.  Neurological: She is alert and oriented to person, place, and time.       CN 3-13 intact  Skin: Skin is warm and dry.  Psychiatric: Her behavior is normal. Judgment and thought content normal.    ED Course  Procedures (including critical care time)  Labs Reviewed - No data to display No results found.   1. Migraine   2. Backache       MDM    This is a 21 year old female with headache and backache. Patient has been seen multiple times for the same complaints  with negative workups. I have given the patient a migraine cocktail, patient states that she is feeling much better. I believe that the patient's back pain is from muscle spasm and I have encouraged patient to continue taking her ibuprofen as prescribed. Also included back strengthening exercises for the patient. I am going to discharge the patient to home and recommend following up with a primary care provider. Educated the patient regarding new health insurance exchange programs. Recommended that  the patient use ice for her back pain. I have discussed this patient with Dr. Jeraldine Loots, who agrees with the plan. Patient is stable and ready for discharge.      Roxy Horseman, PA-C 12/27/11 2131

## 2012-01-01 ENCOUNTER — Encounter (HOSPITAL_COMMUNITY): Payer: Self-pay | Admitting: *Deleted

## 2012-01-01 ENCOUNTER — Emergency Department (HOSPITAL_COMMUNITY)
Admission: EM | Admit: 2012-01-01 | Discharge: 2012-01-01 | Discharge status: Against Medical Advice | Payer: No Typology Code available for payment source | Attending: Emergency Medicine | Admitting: Emergency Medicine

## 2012-01-01 DIAGNOSIS — R51 Headache: Secondary | ICD-10-CM | POA: Insufficient documentation

## 2012-01-01 DIAGNOSIS — M545 Low back pain, unspecified: Secondary | ICD-10-CM | POA: Insufficient documentation

## 2012-01-01 DIAGNOSIS — M549 Dorsalgia, unspecified: Secondary | ICD-10-CM

## 2012-01-01 MED ORDER — DIPHENHYDRAMINE HCL 50 MG/ML IJ SOLN
25.0000 mg | Freq: Once | INTRAMUSCULAR | Status: AC
  Administered 2012-01-01: 25 mg via INTRAMUSCULAR
  Filled 2012-01-01: qty 1

## 2012-01-01 MED ORDER — METOCLOPRAMIDE HCL 5 MG/ML IJ SOLN
10.0000 mg | Freq: Once | INTRAMUSCULAR | Status: AC
  Administered 2012-01-01: 10 mg via INTRAMUSCULAR
  Filled 2012-01-01: qty 2
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
## 2012-01-01 NOTE — ED Notes (Addendum)
PT. RESTING , NURSE EXPLAINED DELAY AND PROCESS.

## 2012-01-04 ENCOUNTER — Emergency Department (HOSPITAL_COMMUNITY): Payer: Self-pay

## 2012-01-04 ENCOUNTER — Emergency Department (HOSPITAL_COMMUNITY)
Admission: EM | Admit: 2012-01-04 | Discharge: 2012-01-04 | Disposition: A | Discharge status: Home / Self Care | Payer: Self-pay | Attending: Emergency Medicine | Admitting: Emergency Medicine

## 2012-01-04 DIAGNOSIS — Y9389 Activity, other specified: Secondary | ICD-10-CM | POA: Insufficient documentation

## 2012-01-04 DIAGNOSIS — Y9289 Other specified places as the place of occurrence of the external cause: Secondary | ICD-10-CM | POA: Insufficient documentation

## 2012-01-04 DIAGNOSIS — IMO0002 Reserved for concepts with insufficient information to code with codable children: Secondary | ICD-10-CM | POA: Insufficient documentation

## 2012-01-04 DIAGNOSIS — W19XXXA Unspecified fall, initial encounter: Secondary | ICD-10-CM

## 2012-01-04 DIAGNOSIS — S0990XA Unspecified injury of head, initial encounter: Secondary | ICD-10-CM | POA: Insufficient documentation

## 2012-01-04 DIAGNOSIS — R296 Repeated falls: Secondary | ICD-10-CM | POA: Insufficient documentation

## 2012-01-04 LAB — CBC WITH DIFFERENTIAL/PLATELET
Eosinophils Absolute: 0.1 10*3/uL (ref 0.0–0.7)
Eosinophils Relative: 1 % (ref 0–5)
Lymphs Abs: 2.3 10*3/uL (ref 0.7–4.0)
MCH: 25.7 pg — ABNORMAL LOW (ref 26.0–34.0)
MCV: 78.5 fL (ref 78.0–100.0)
Monocytes Absolute: 0.5 10*3/uL (ref 0.1–1.0)
Platelets: 280 10*3/uL (ref 150–400)
RBC: 4.55 MIL/uL (ref 3.87–5.11)

## 2012-01-04 LAB — URINALYSIS, ROUTINE W REFLEX MICROSCOPIC
Nitrite: NEGATIVE
Specific Gravity, Urine: 1.014 (ref 1.005–1.030)
Urobilinogen, UA: 0.2 mg/dL (ref 0.0–1.0)
pH: 6 (ref 5.0–8.0)

## 2012-01-04 LAB — COMPREHENSIVE METABOLIC PANEL
ALT: 8 U/L (ref 0–35)
BUN: 8 mg/dL (ref 6–23)
Calcium: 9.7 mg/dL (ref 8.4–10.5)
Creatinine, Ser: 0.75 mg/dL (ref 0.50–1.10)
GFR calc Af Amer: >90 mL/min (ref >90)
Glucose, Bld: 110 mg/dL — ABNORMAL HIGH (ref 70–99)
Sodium: 135 mEq/L (ref 135–145)
Total Protein: 7.4 g/dL (ref 6.0–8.3)

## 2012-01-04 LAB — URINE MICROSCOPIC-ADD ON

## 2012-01-04 LAB — PREGNANCY, URINE: Preg Test, Ur: NEGATIVE

## 2012-01-04 MED ORDER — KETOROLAC TROMETHAMINE 30 MG/ML IJ SOLN
30.0000 mg | Freq: Once | INTRAMUSCULAR | Status: AC
  Administered 2012-01-04: 30 mg via INTRAVENOUS
  Filled 2012-01-04: qty 1

## 2012-01-04 MED ORDER — TRAMADOL HCL 50 MG PO TABS: 50.0000 mg | ORAL_TABLET | Freq: Four times a day (QID) | ORAL | Status: DC | PRN
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
## 2012-01-04 NOTE — ED Provider Notes (Signed)
Medical screening examination/treatment/procedure(s) were performed by non-physician practitioner and as supervising physician I was immediately available for consultation/collaboration.  Ethelda Chick, MD 01/04/12 [phone]
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
## 2012-02-25 ENCOUNTER — Encounter (HOSPITAL_COMMUNITY): Payer: Self-pay

## 2012-02-25 ENCOUNTER — Emergency Department (HOSPITAL_COMMUNITY)
Admission: EM | Admit: 2012-02-25 | Discharge: 2012-02-25 | Disposition: A | Discharge status: Home / Self Care | Payer: Self-pay | Attending: Emergency Medicine | Admitting: Emergency Medicine

## 2012-02-25 DIAGNOSIS — B009 Herpesviral infection, unspecified: Secondary | ICD-10-CM | POA: Insufficient documentation

## 2012-02-25 DIAGNOSIS — R300 Dysuria: Secondary | ICD-10-CM | POA: Insufficient documentation

## 2012-02-25 LAB — WET PREP, GENITAL
Trich, Wet Prep: NONE SEEN
Yeast Wet Prep HPF POC: NONE SEEN

## 2012-02-25 LAB — GC/CHLAMYDIA PROBE AMP: CT Probe RNA: NEGATIVE

## 2012-02-25 LAB — URINE MICROSCOPIC-ADD ON

## 2012-02-25 LAB — URINALYSIS, ROUTINE W REFLEX MICROSCOPIC
Bilirubin Urine: NEGATIVE
Hgb urine dipstick: NEGATIVE
Ketones, ur: NEGATIVE mg/dL
Nitrite: NEGATIVE
Urobilinogen, UA: 1 mg/dL (ref 0.0–1.0)

## 2012-02-25 MED ORDER — ACYCLOVIR 400 MG PO TABS: 800.0000 mg | ORAL_TABLET | Freq: Every day | ORAL | Status: DC

## 2012-02-25 MED ORDER — ACYCLOVIR 200 MG PO CAPS
400.0000 mg | ORAL_CAPSULE | Freq: Once | ORAL | Status: AC
  Administered 2012-02-25: 400 mg via ORAL
  Filled 2012-02-25: qty 2

## 2012-02-25 MED ORDER — NAPROXEN 500 MG PO TABS: 500.0000 mg | ORAL_TABLET | Freq: Two times a day (BID) | ORAL | Status: DC

## 2012-02-25 MED ORDER — NAPROXEN 250 MG PO TABS
500.0000 mg | ORAL_TABLET | Freq: Once | ORAL | Status: AC
  Administered 2012-02-25: 500 mg via ORAL
  Filled 2012-02-25: qty 2

## 2012-02-25 NOTE — ED Notes (Signed)
Pt reports "stinging sensation" w/urination, wiping, or bathing x2 days. Pt denies vaginal odor, d/c, or bleeding

## 2012-02-25 NOTE — ED Provider Notes (Signed)
History     CSN: [id number]  Arrival date & time 02/25/12  1191   First MD Initiated Contact with Patient 02/25/12 0116      Chief Complaint  Patient presents with  . Vaginal Pain    (Consider location/radiation/quality/duration/timing/severity/associated sxs/prior treatment) HPI Comments: Otherwise healthy 21 year old female who presents with a complaint of dysuria and burning when she urinates, feeling and irritation when she wipes with toilet paper but denies vaginal discharge, vaginal bleeding, abdominal pain, fevers chills nausea or vomiting. The symptoms are mild, intermittent, worse with wiping, worse with urinating. The patient denies any history of significant infections, no history of urinary infections, no history of sexual transmitted diseases.  Patient is a 21 y.o. female presenting with vaginal pain. The history is provided by the patient and medical records.  Vaginal Pain    History reviewed. No pertinent past medical history.  History reviewed. No pertinent past surgical history.  History reviewed. No pertinent family history.  History  Substance Use Topics  . Smoking status: Never Smoker   . Smokeless tobacco: Not on file  . Alcohol Use: No    OB History    Grav Para Term Preterm Abortions TAB SAB Ect Mult Living                  Review of Systems  Genitourinary: Positive for vaginal pain.  All other systems reviewed and are negative.    Allergies  Amoxicillin  Home Medications   Current Outpatient Rx  Name  Route  Sig  Dispense  Refill  . IBUPROFEN 200 MG PO TABS   Oral   Take 400 mg by mouth every 6 (six) hours as needed. For pain         . MEDROXYPROGESTERONE ACETATE 150 MG/ML IM SUSP   Intramuscular   Inject 150 mg into the muscle every 3 (three) months.         . ACYCLOVIR 400 MG PO TABS   Oral   Take 2 tablets (800 mg total) by mouth 5 (five) times daily.   50 tablet   0   . NAPROXEN 500 MG PO TABS   Oral   Take 1  tablet (500 mg total) by mouth 2 (two) times daily with a meal.   30 tablet   0     BP 134/68  Pulse 79  Temp 98.2 F (36.8 C) (Oral)  Resp 16  SpO2 100%  Physical Exam  Nursing note and vitals reviewed. Constitutional: She appears well-developed and well-nourished. No distress.  HENT:  Head: Normocephalic and atraumatic.  Mouth/Throat: Oropharynx is clear and moist. No oropharyngeal exudate.  Eyes: Conjunctivae normal and EOM are normal. Pupils are equal, round, and reactive to light. Right eye exhibits no discharge. Left eye exhibits no discharge. No scleral icterus.  Neck: Normal range of motion. Neck supple. No JVD present. No thyromegaly present.  Cardiovascular: Normal rate, regular rhythm, normal heart sounds and intact distal pulses.  Exam reveals no gallop and no friction rub.   No murmur heard. Pulmonary/Chest: Effort normal and breath sounds normal. No respiratory distress. She has no wheezes. She has no rales.  Abdominal: Soft. Bowel sounds are normal. She exhibits no distension and no mass. There is no tenderness.  Genitourinary:       Chaperone present for entire pelvic exam, introitus is normal appearing other than several small ulcerative lesions, no foul smelling discharge, no signs of vaginitis, patient does not tolerate speculum exam nor bimanual exam.  Musculoskeletal: Normal range of motion. She exhibits no edema and no tenderness.  Lymphadenopathy:    She has no cervical adenopathy.  Neurological: She is alert. Coordination normal.  Skin: Skin is warm and dry. No rash noted. No erythema.  Psychiatric: She has a normal mood and affect. Her behavior is normal.    ED Course  Procedures (including critical care time)  Labs Reviewed  URINALYSIS, ROUTINE W REFLEX MICROSCOPIC - Abnormal; Notable for the following:    APPearance CLOUDY (*)     Leukocytes, UA MODERATE (*)     All other components within normal limits  WET PREP, GENITAL - Abnormal; Notable for  the following:    Clue Cells Wet Prep HPF POC FEW (*)     WBC, Wet Prep HPF POC MODERATE (*)     All other components within normal limits  POCT PREGNANCY, URINE  URINE MICROSCOPIC-ADD ON  GC/CHLAMYDIA PROBE AMP   No results found.   1. Herpes       MDM  Patient has clinical herpes, will be referred to health department for further testing, urinalysis pending. Patient informed of needing to tell sexual contacts, encouraged to use barrier contraception, we'll send home with prescription medications for necessary medications including pain and infection. Gonorrhea and Chlamydia panels sent, wet prep and  Prep negative for any other acute infections, GC Chlamydia sent, urinalysis without obvious infection, patient with genital herpes, acyclovir given, home with acyclovir and Naprosyn and further STD testing, patient encouraged strongly to go to the health department for further testing including HIV and has expressed her understanding.      Vida Roller, MD 02/25/12 [phone]

## 2012-03-23 ENCOUNTER — Telehealth: Payer: Self-pay | Admitting: Family Medicine

## 2012-03-23 ENCOUNTER — Ambulatory Visit (INDEPENDENT_AMBULATORY_CARE_PROVIDER_SITE_OTHER): Payer: Self-pay | Admitting: Family Medicine

## 2012-03-23 VITALS — BP 110/80 | HR 88 | Temp 99.2°F | Wt 179.4 lb

## 2012-03-23 DIAGNOSIS — A088 Other specified intestinal infections: Secondary | ICD-10-CM

## 2012-03-23 DIAGNOSIS — A084 Viral intestinal infection, unspecified: Secondary | ICD-10-CM

## 2012-03-23 NOTE — Telephone Encounter (Signed)
Pt brought her daughter yesterday for a stomach bug and now she is feeling bad w/ same symptoms.  She is asking for a note for work today

## 2012-03-23 NOTE — Assessment & Plan Note (Addendum)
Advised of normal course and supportive care. Note for work.

## 2012-03-23 NOTE — Progress Notes (Signed)
  Subjective:    Patient ID: Susan Stone, female    DOB: [birth year], 22 y.o.   MRN: [id number]  Abdominal Pain This is a new problem. The current episode started today. The onset quality is sudden. The problem occurs constantly. The most recent episode lasted 6 hours. The problem has been unchanged. The pain is located in the generalized abdominal region. The quality of the pain is cramping. Associated symptoms include diarrhea, headaches, nausea and vomiting. Pertinent negatives include no dysuria or fever. Nothing aggravates the pain. The pain is relieved by nothing. She has tried nothing for the symptoms.  Her daughter had viral GE yesterday and she developed exact same symptoms today.   Review of Systems  Constitutional: Negative for fever and chills.  Gastrointestinal: Positive for nausea, vomiting, abdominal pain and diarrhea.  Genitourinary: Negative for dysuria and urgency.  Neurological: Positive for headaches.       Objective:   Physical Exam  Vitals reviewed. Constitutional: She is oriented to person, place, and time. She appears well-developed and well-nourished.  HENT:  Head: Normocephalic and atraumatic.  Neck: Neck supple.  Cardiovascular: Normal rate and regular rhythm.   Pulmonary/Chest: Breath sounds normal. She has no wheezes.  Abdominal: Bowel sounds are normal. There is no tenderness.  Musculoskeletal: Normal range of motion.  Neurological: She is alert and oriented to person, place, and time.  Skin: Skin is warm and dry. No rash noted.          Assessment & Plan:

## 2012-03-23 NOTE — Telephone Encounter (Signed)
Patient states she started vomiting this AM about 2:00 AM. Has vomited frequently and also has diarrhea. Consulted with Dr. Gwendolyn Grant and he advises mother will need to be seen in order to  give note for her illness. Appointment scheduled today at 1:30 for work in.   Encouraged her to sip on clear liquids slowly .

## 2012-03-23 NOTE — Patient Instructions (Addendum)
Viral Gastroenteritis Viral gastroenteritis is also known as stomach flu. This condition affects the stomach and intestinal tract. It can cause sudden diarrhea and vomiting. The illness typically lasts 3 to 8 days. Most people develop an immune response that eventually gets rid of the virus. While this natural response develops, the virus can make you quite ill. CAUSES  Many different viruses can cause gastroenteritis, such as rotavirus or noroviruses. You can catch one of these viruses by consuming contaminated food or water. You may also catch a virus by sharing utensils or other personal items with an infected person or by touching a contaminated surface. SYMPTOMS  The most common symptoms are diarrhea and vomiting. These problems can cause a severe loss of body fluids (dehydration) and a body salt (electrolyte) imbalance. Other symptoms may include:  Fever.  Headache.  Fatigue.  Abdominal pain. DIAGNOSIS  Your caregiver can usually diagnose viral gastroenteritis based on your symptoms and a physical exam. A stool sample may also be taken to test for the presence of viruses or other infections. TREATMENT  This illness typically goes away on its own. Treatments are aimed at rehydration. The most serious cases of viral gastroenteritis involve vomiting so severely that you are not able to keep fluids down. In these cases, fluids must be given through an intravenous line (IV). HOME CARE INSTRUCTIONS   Drink enough fluids to keep your urine clear or pale yellow. Drink small amounts of fluids frequently and increase the amounts as tolerated.  Ask your caregiver for specific rehydration instructions.  Avoid:  Foods high in sugar.  Alcohol.  Carbonated drinks.  Tobacco.  Juice.  Caffeine drinks.  Extremely hot or cold fluids.  Fatty, greasy foods.  Too much intake of anything at one time.  Dairy products until 24 to 48 hours after diarrhea stops.  You may consume probiotics.  Probiotics are active cultures of beneficial bacteria. They may lessen the amount and number of diarrheal stools in adults. Probiotics can be found in yogurt with active cultures and in supplements.  Wash your hands well to avoid spreading the virus.  Only take over-the-counter or prescription medicines for pain, discomfort, or fever as directed by your caregiver. Do not give aspirin to children. Antidiarrheal medicines are not recommended.  Ask your caregiver if you should continue to take your regular prescribed and over-the-counter medicines.  Keep all follow-up appointments as directed by your caregiver. SEEK IMMEDIATE MEDICAL CARE IF:   You are unable to keep fluids down.  You do not urinate at least once every 6 to 8 hours.  You develop shortness of breath.  You notice blood in your stool or vomit. This may look like coffee grounds.  You have abdominal pain that increases or is concentrated in one small area (localized).  You have persistent vomiting or diarrhea.  You have a fever.  The patient is a child younger than 3 months, and he or she has a fever.  The patient is a child older than 3 months, and he or she has a fever and persistent symptoms.  The patient is a child older than 3 months, and he or she has a fever and symptoms suddenly get worse.  The patient is a baby, and he or she has no tears when crying. MAKE SURE YOU:   Understand these instructions.  Will watch your condition.  Will get help right away if you are not doing well or get worse. Document Released: 02/22/2005 Document Revised: 05/17/2011 Document Reviewed: 12/09/2010   Exit[employer] Patient Information 2013 [employer].  

## 2012-09-06 ENCOUNTER — Emergency Department (HOSPITAL_COMMUNITY)
Admission: EM | Admit: 2012-09-06 | Discharge: 2012-09-06 | Disposition: A | Discharge status: Home / Self Care | Payer: No Typology Code available for payment source | Attending: Emergency Medicine | Admitting: Emergency Medicine

## 2012-09-06 ENCOUNTER — Encounter (HOSPITAL_COMMUNITY): Payer: Self-pay | Admitting: *Deleted

## 2012-09-06 DIAGNOSIS — Y939 Activity, unspecified: Secondary | ICD-10-CM | POA: Insufficient documentation

## 2012-09-06 DIAGNOSIS — Y92410 Unspecified street and highway as the place of occurrence of the external cause: Secondary | ICD-10-CM | POA: Insufficient documentation

## 2012-09-06 DIAGNOSIS — S43499A Other sprain of unspecified shoulder joint, initial encounter: Secondary | ICD-10-CM | POA: Insufficient documentation

## 2012-09-06 DIAGNOSIS — S4980XA Other specified injuries of shoulder and upper arm, unspecified arm, initial encounter: Secondary | ICD-10-CM | POA: Insufficient documentation

## 2012-09-06 DIAGNOSIS — S46819A Strain of other muscles, fascia and tendons at shoulder and upper arm level, unspecified arm, initial encounter: Secondary | ICD-10-CM | POA: Insufficient documentation

## 2012-09-06 DIAGNOSIS — S199XXA Unspecified injury of neck, initial encounter: Secondary | ICD-10-CM | POA: Insufficient documentation

## 2012-09-06 DIAGNOSIS — S46812A Strain of other muscles, fascia and tendons at shoulder and upper arm level, left arm, initial encounter: Secondary | ICD-10-CM

## 2012-09-06 DIAGNOSIS — S0993XA Unspecified injury of face, initial encounter: Secondary | ICD-10-CM | POA: Insufficient documentation

## 2012-09-06 DIAGNOSIS — S46909A Unspecified injury of unspecified muscle, fascia and tendon at shoulder and upper arm level, unspecified arm, initial encounter: Secondary | ICD-10-CM | POA: Insufficient documentation

## 2012-09-06 DIAGNOSIS — Z880 Allergy status to penicillin: Secondary | ICD-10-CM | POA: Insufficient documentation

## 2012-09-06 DIAGNOSIS — S0990XA Unspecified injury of head, initial encounter: Secondary | ICD-10-CM | POA: Insufficient documentation

## 2012-09-06 MED ORDER — IBUPROFEN 800 MG PO TABS: 800.0000 mg | ORAL_TABLET | Freq: Three times a day (TID) | ORAL | Status: DC

## 2012-09-06 MED ORDER — METHOCARBAMOL 500 MG PO TABS: 500.0000 mg | ORAL_TABLET | Freq: Two times a day (BID) | ORAL | Status: DC

## 2012-09-06 NOTE — ED Notes (Signed)
ED PA at bedside
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
## 2012-09-06 NOTE — ED Notes (Signed)
Pt dc to home. Pt sts understanding to dc instructions. Pt ambulatory to exit without difficulty.  Pt denies need for w/c.  

## 2012-09-08 NOTE — ED Provider Notes (Signed)
Medical screening examination/treatment/procedure(s) were performed by non-physician practitioner and as supervising physician I was immediately available for consultation/collaboration.   Richardean Canal, MD 09/08/12 [phone]

## 2012-10-06 ENCOUNTER — Ambulatory Visit (INDEPENDENT_AMBULATORY_CARE_PROVIDER_SITE_OTHER): Payer: Self-pay | Admitting: Family Medicine

## 2012-10-06 ENCOUNTER — Encounter: Payer: Self-pay | Admitting: Family Medicine

## 2012-10-06 VITALS — BP 131/80 | HR 95 | Wt 194.3 lb

## 2012-10-06 DIAGNOSIS — N912 Amenorrhea, unspecified: Secondary | ICD-10-CM

## 2012-10-06 LAB — POCT URINE PREGNANCY: Preg Test, Ur: NEGATIVE

## 2012-10-06 NOTE — Assessment & Plan Note (Signed)
Upreg negative. Could be abnormal uterine bleeding from withdrawal from depo. Continue to monitor. If she has any signs of pregnancy she should take a home pregnancy test. Return to clinic for any concerns.

## 2012-10-06 NOTE — Patient Instructions (Signed)

## 2012-10-06 NOTE — Progress Notes (Signed)
Patient ID: Judson Roch, female   DOB: 12/22/1990, 22 y.o.   MRN: [id number]  [facility] Tanis Hensarling M. Jomel Whittlesey, MD Phone: [phone]   Subjective: HPI: Patient is a 22 y.o. female presenting to clinic today for same day appointment for pregnancy test.  Amenorrhea Patient presents for evaluation of amenorrhea. LMP was June 17, and did not have one in July.  Periods were regular in the past occurring every 1 month. Is there a chance of pregnancy? yes. HCG lab test done? yes, negative. Factors that may be contributory to menstrual abnormalities include: None. She has a little stress with new job.Marland Kitchen Previous treatments for menstrual abnormalities include: Never happened before. Was on depo until earlier this year.  History Reviewed: Non-smoker.  ROS: Please see HPI above.  Objective: Office vital signs reviewed. BP 131/80  Pulse 95  Wt 194 lb 4.8 oz (88.134 kg)  BMI 34.43 kg/m2  LMP 08/22/2012  Physical Examination:  General: Awake, alert. NAD Pulm: CTAB, no wheezes Cardio: RRR, no murmurs appreciated Abdomen:+BS, soft, nontender, nondistended Extremities: No edema Neuro: Grossly intact  Assessment: 22 y.o. female with amenorrhea   Plan: See Problem List and After Visit Summary

## 2012-10-10 ENCOUNTER — Telehealth: Payer: Self-pay | Admitting: Family Medicine

## 2012-10-10 NOTE — Telephone Encounter (Signed)
Pt call returned and informed that is it more than likely the start of menstrual cycle - denied any other problems and informed to call if any furhter problems. Wyatt Haste, RN-BSN

## 2012-10-10 NOTE — Telephone Encounter (Signed)
Patient wants to speak to the nurse about when she wipes there was a brownish discharge.  She was late, so she isn't sure if this discharge is because she was late or if she should be concerned.

## 2013-02-02 ENCOUNTER — Encounter (HOSPITAL_COMMUNITY): Payer: Self-pay | Admitting: Emergency Medicine

## 2013-02-02 ENCOUNTER — Emergency Department (HOSPITAL_COMMUNITY)
Admission: EM | Admit: 2013-02-02 | Discharge: 2013-02-02 | Disposition: A | Discharge status: Home / Self Care | Payer: Medicaid Other | Attending: Emergency Medicine | Admitting: Emergency Medicine

## 2013-02-02 DIAGNOSIS — R0789 Other chest pain: Secondary | ICD-10-CM

## 2013-02-02 DIAGNOSIS — Z880 Allergy status to penicillin: Secondary | ICD-10-CM | POA: Insufficient documentation

## 2013-02-02 DIAGNOSIS — Z3202 Encounter for pregnancy test, result negative: Secondary | ICD-10-CM | POA: Insufficient documentation

## 2013-02-02 DIAGNOSIS — R071 Chest pain on breathing: Secondary | ICD-10-CM | POA: Insufficient documentation

## 2013-02-02 LAB — URINALYSIS, ROUTINE W REFLEX MICROSCOPIC
Bilirubin Urine: NEGATIVE
Glucose, UA: NEGATIVE mg/dL
Ketones, ur: NEGATIVE mg/dL
pH: 6 (ref 5.0–8.0)

## 2013-02-02 MED ORDER — IBUPROFEN 200 MG PO TABS
400.0000 mg | ORAL_TABLET | Freq: Once | ORAL | Status: AC
  Administered 2013-02-02: 400 mg via ORAL
  Filled 2013-02-02: qty 2

## 2013-02-02 NOTE — ED Notes (Signed)
Pt presents with Right flank pain x3 days, states the pain has worsened over night, pt denies burning with urination, blood in her urine, nausea, vomiting, abnormal vaginal odor or discharge, or a hx of kidney stones.

## 2013-02-02 NOTE — ED Provider Notes (Signed)
CSN: [id number]     Arrival date & time 02/02/13  0756 History   First MD Initiated Contact with Patient 02/02/13 [phone]     Chief Complaint  Patient presents with  . Flank Pain   (Consider location/radiation/quality/duration/timing/severity/associated sxs/prior Treatment) HPI Comments: Susan Stone is a 22 y.o. female who presents for evaluation of right sided flank pain for 3 days. The pain waxes and wanes. The pain is worse at night when she is trying to sleep. She denies fever, chills, nausea, vomiting, diarrhea, dysuria, urinary frequency, or paresthesias. 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 There are no other known modifying factors.   Patient is a 22 y.o. female presenting with flank pain. The history is provided by the patient.  Flank Pain    History reviewed. No pertinent past medical history. History reviewed. No pertinent past surgical history. History reviewed. No pertinent family history. History  Substance Use Topics  . Smoking status: Never Smoker   . Smokeless tobacco: Not on file  . Alcohol Use: No   OB History   Grav Para Term Preterm Abortions TAB SAB Ect Mult Living                 Review of Systems  Genitourinary: Positive for flank pain.  All other systems reviewed and are negative.    Allergies  Amoxicillin  Home Medications   Current Outpatient Rx  Name  Route  Sig  Dispense  Refill  . acetaminophen (TYLENOL) 500 MG tablet   Oral   Take 500 mg by mouth daily as needed for moderate pain.         Marland Kitchen aspirin 325 MG tablet   Oral   Take 650 mg by mouth daily as needed for moderate pain.          BP 115/63  Pulse 83  Temp(Src) 98.2 F (36.8 C) (Oral)  Resp 20  Ht 5\' 3"  (1.6 m)  Wt 189 lb (85.73 kg)  BMI 33.49 kg/m2  SpO2 100% Physical Exam  Nursing note and vitals reviewed. Constitutional: She is oriented to person, place, and time. She appears well-developed and  well-nourished.  HENT:  Head: Normocephalic and atraumatic.  Eyes: Conjunctivae and EOM are normal. Pupils are equal, round, and reactive to light.  Neck: Normal range of motion and phonation normal. Neck supple.  Cardiovascular: Normal rate, regular rhythm and intact distal pulses.   Pulmonary/Chest: Effort normal and breath sounds normal. She exhibits tenderness (mild right lateral chest wall tenderness, diffuse, without crepitation or deformity).  Abdominal: Soft. She exhibits no distension and no mass. There is no tenderness. There is no guarding.  Musculoskeletal: Normal range of motion.  Neurological: She is alert and oriented to person, place, and time. She exhibits normal muscle tone.  Skin: Skin is warm and dry.  Psychiatric: She has a normal mood and affect. Her behavior is normal. Judgment and thought content normal.    ED Course  Procedures (including critical care time) Medications  ibuprofen (ADVIL,MOTRIN) tablet 400 mg (400 mg Oral Given 02/02/13 0906)     Patient Vitals for the past 24 hrs:  BP Temp Temp src Pulse Resp SpO2 Height Weight  02/02/13 1127 115/63 mmHg 98.2 F (36.8 C) Oral 83 20 100 % - -  02/02/13 0800 111/66 mmHg 98.2 F (36.8 C) Oral 82 18 100 % 5\' 3"  (1.6 m) 189 lb (85.73 kg)     12:04  PM Reevaluation with update and discussion. After initial assessment and treatment, an updated evaluation reveals she is comfortable. Now. There are no further complaints. Susan Stone   Labs Review Labs Reviewed  URINALYSIS, ROUTINE W REFLEX MICROSCOPIC  PREGNANCY, URINE   Imaging Review No results found.  EKG Interpretation   None       MDM   1. Chest wall pain      Evaluation is consistent with musculoskeletal chest wall pain, possibly secondary to work related movements. Doubt fracture, pneumonia, pneumothorax. She is stable for discharge   Nursing Notes Reviewed/ Care Coordinated, and agree without changes. Applicable Imaging Reviewed.   Interpretation of Laboratory Data incorporated into ED treatment   Plan: Home Medications- ibuprofen; Home Treatments and Observation- heat; return here if the recommended treatment, does not improve the symptoms; Recommended follow up- PCP of choice as needed    Flint Melter, MD 02/02/13 1704

## 2013-03-24 ENCOUNTER — Encounter (HOSPITAL_COMMUNITY): Payer: Self-pay | Admitting: Emergency Medicine

## 2013-03-24 ENCOUNTER — Emergency Department (INDEPENDENT_AMBULATORY_CARE_PROVIDER_SITE_OTHER)
Admission: EM | Admit: 2013-03-24 | Discharge: 2013-03-24 | Disposition: A | Discharge status: Home / Self Care | Payer: Self-pay | Source: Home / Self Care | Attending: Emergency Medicine | Admitting: Emergency Medicine

## 2013-03-24 ENCOUNTER — Other Ambulatory Visit (HOSPITAL_COMMUNITY)
Admission: RE | Admit: 2013-03-24 | Discharge: 2013-03-24 | Disposition: A | Discharge status: Home / Self Care | Payer: Medicaid Other | Source: Ambulatory Visit | Attending: Emergency Medicine | Admitting: Emergency Medicine

## 2013-03-24 DIAGNOSIS — N94810 Vulvar vestibulitis: Secondary | ICD-10-CM

## 2013-03-24 DIAGNOSIS — N760 Acute vaginitis: Secondary | ICD-10-CM | POA: Insufficient documentation

## 2013-03-24 DIAGNOSIS — Z113 Encounter for screening for infections with a predominantly sexual mode of transmission: Secondary | ICD-10-CM | POA: Insufficient documentation

## 2013-03-24 LAB — POCT URINALYSIS DIP (DEVICE)
Bilirubin Urine: NEGATIVE
GLUCOSE, UA: NEGATIVE mg/dL
Hgb urine dipstick: NEGATIVE
Ketones, ur: NEGATIVE mg/dL
NITRITE: NEGATIVE
PROTEIN: NEGATIVE mg/dL
SPECIFIC GRAVITY, URINE: 1.025 (ref 1.005–1.030)
UROBILINOGEN UA: 1 mg/dL (ref 0.0–1.0)
pH: 7 (ref 5.0–8.0)

## 2013-03-24 LAB — POCT PREGNANCY, URINE: Preg Test, Ur: NEGATIVE

## 2013-03-24 NOTE — Discharge Instructions (Signed)
Apply vaseline to affected area 4 times daily.    Disorders of the Vulva It is important to know the outside (external) area of the female genitalia to properly examine yourself. The vulva or labia, sometimes also called the lips around the vagina, covers the pubic bone and surrounds the vagina. The larger or outer labia is called the labia majora. The inner or smaller labia is called the labia minora. The clitoris is at the top of the vulva. It is covered by a small area of tissue called the hood. Below the clitoris is the opening of the tube from the bladder, which is the urethral opening. Just below the urethral opening is the opening of the vagina. This area is called the vestibule. Inside the vestibule are bartholin and skene glands that lubricate the outside of the vagina during sexual intercourse. The perineum is the area that lies between the vagina and anus. You should examine your external genitalia once a month just as you do your self breast examination. SIGNS AND SYMPTOMS TO LOOK FOR DURING YOUR EXAMINATION:  Swelling.  Redness.  Bumps.  Blisters.  Black or white areas.  Itching.  Bleeding.  Burning. TYPES OF VULVAR PROBLEMS  Infections.  Yeast (fungus) is the most common infection that causes redness, swelling, itching and a thick white vaginal discharge. Women with diabetes, taking medicines that kill germs (antibiotics) or on birth control pills are at risk for yeast infection. This infection is treated with vaginal creams, suppositories and anti-itch cream for the vulva.  Genital warts (condyloma) are caused by the human papilloma virus. They are raised bumps that can itch, bleed, cause discomfort and sometimes appear in groups like cauliflower. This is a sexually transmitted disease (STD) caused by sexual contact. This can be treated with topical medication, freezing, electrocautery, laser or surgical removal.  Genital herpes is a virus that causes redness, swelling,  blisters and ulcers that can cause itching and are very painful. This is also a STD. There are medications to control the symptoms of herpes, but there is no cure. Once you have it, you keep getting it because the virus stays in your body.  Contact dermatitis. Contact dermatitis is an irritation of the vulva that can cause redness, swelling and itching. It can be caused by:  Perfumed toilet paper.  Deodorants.  Talcum powder.  Hygiene sprays.  Tampons.  Soaps and fabric softeners.  Wearing wet underwear and bathing suits too long.  Spermicide.  Condoms.  Diaphragms.  Poison ivy. Treatment will depend on eliminating the cause and treating the symptoms.  Vulvodynia.  Vulvodynia is "painful vulva." It includes burning, itching, irritation and a feeling of rawness or bruising of the vulva. Treating this problem depends on the cause and diagnosis. Treatment can take a long time.  Vulvar Dystrophy.  Vulvar Dystrophy is a disorder of the skin of the vulva. The skin can be too thick with hard raised patches or too thin skin showing wrinkles. Vulvar dystrophy can cause redness or white patches, itching and burning sensation. A biopsy may be needed to diagnose the problem. Treatment with creams or ointments depends on the diagnosis.  Vulvar Cancer.  Vulvar cancer is usually a form of squamus skin cancer. Other types of vulvar cancer like melenoma (a dark or black, irregular shaped mole that can bleed easily) and adenocarcinoma (red, itchy, scaly area that looks like eczema) are rare. Treatment is usually surgery to remove the cancerous area, the vulva and the lymph nodes in the groin.  This can be done with or without radiation and chemotherapy. HOME CARE INSTRUCTIONS   Look at your vulva and external genital area every month.  Follow and finish all treatment given to you by your caregiver.  Do not use perfumed or scented soaps, detergents, hygiene spray, talcum powder, tampons,  douches or toilet paper.  Remove your tampon every 6 hours. Do not leave tampons in overnight.  Wear loose-fitting clothing.  Wear underwear that is cotton.  Avoid spermicidal creams or foams that irritate you. SEEK MEDICAL CARE IF:   You notice changes on your vulva such as redness, swelling, itching, color changes, bleeding, small bumps, blisters or any discomfort.  You develop an abnormal vaginal discharge.  You have painful sexual intercourse.  You notice swelling of the lymph nodes in your groin. Document Released: 08/11/2007 Document Revised: 05/17/2011 Document Reviewed: 05/25/2010 [facility] Patient Information 2014 [place], [place].

## 2013-03-24 NOTE — ED Notes (Signed)
Pt c/o vag pain only when she wipes after urinating onset yest Denies: inj/trauma, dysuria, blood when she wipes, vag d/c, f/v/n/d She is alert w/no signs of acute distress.

## 2013-03-24 NOTE — ED Provider Notes (Signed)
Chief Complaint:   Chief Complaint  Patient presents with  . Vaginal Pain    History of Present Illness:   Susan Stone is a 23 year old female who's had a three-day history of posterior vulvar pain. It hurts when she wipes herself. She denies any lesions, she's had no discharge or itching. No vaginal pain, pelvic pain, or lower back pain. She's had no urinary symptoms. No fever, chills, nausea, or vomiting. Her last menstrual period was December 31. She denies any sexual activity in the last month.  Review of Systems:  Other than noted above, the patient denies any of the following symptoms: Systemic:  No fever, chills, sweats, or weight loss. GI:  No abdominal pain, nausea, anorexia, vomiting, diarrhea, constipation, melena or hematochezia. GU:  No dysuria, frequency, urgency, hematuria, vaginal discharge, itching, or abnormal vaginal bleeding. Skin:  No rash or itching.  PMFSH:  Past medical history, family history, social history, meds, and allergies were reviewed.  She is allergic to amoxicillin.  Physical Exam:   Vital signs:  Pulse 71  Temp(Src) 98.7 F (37.1 C) (Oral)  Resp 17  SpO2 100%  LMP 03/08/2013 General:  Alert, oriented and in no distress. Lungs:  Breath sounds clear and equal bilaterally.  No wheezes, rales or rhonchi. Heart:  Regular rhythm.  No gallops or murmers. Abdomen:  Soft, flat and non-distended.  No organomegaly or mass.  No tenderness, guarding or rebound.  Bowel sounds normally active. Pelvic exam:  Reveals normal vulva. There were no ulcerations, no lesions, no erythema, no discharge or exudate. The area of pain it was localized to the posterior vaginal introitus. This appears completely normal. Vaginal and cervical mucosa were normal. She has a scant whitish discharge. There was no pain on cervical motion, uterus was normal in size and shape and nontender, no adnexal masses or tenderness. DNA probes for gonorrhea, Chlamydia, Trichomonas, and Gardnerella  were obtained. Also obtained viral cultures for herpes simplex virus. Skin:  Clear, warm and dry.  Labs:   Results for orders placed during the hospital encounter of 03/24/13  POCT URINALYSIS DIP (DEVICE)      Result Value Range   Glucose, UA NEGATIVE  NEGATIVE mg/dL   Bilirubin Urine NEGATIVE  NEGATIVE   Ketones, ur NEGATIVE  NEGATIVE mg/dL   Specific Gravity, Urine 1.025  1.005 - 1.030   Hgb urine dipstick NEGATIVE  NEGATIVE   pH 7.0  5.0 - 8.0   Protein, ur NEGATIVE  NEGATIVE mg/dL   Urobilinogen, UA 1.0  0.0 - 1.0 mg/dL   Nitrite NEGATIVE  NEGATIVE   Leukocytes, UA TRACE (*) NEGATIVE  POCT PREGNANCY, URINE      Result Value Range   Preg Test, Ur NEGATIVE  NEGATIVE    Assessment:  The encounter diagnosis was Vestibulitis, vulvar.  Plan:   1.  Meds:  The following meds were prescribed:   Discharge Medication List as of 03/24/2013  3:38 PM      2.  Patient Education/Counseling:  The patient was given appropriate handouts, self care instructions, and instructed in symptomatic relief.  Will wait results of testing. In the meantime suggested she use Vaseline.  3.  Follow up:  The patient was told to follow up if no better in 3 to 4 days, if becoming worse in any way, and given some red flag symptoms such as worsening pain which would prompt immediate return.  [facility] if no better in one to 2 weeks.  Reuben Likes, MD 03/24/13 2053

## 2013-03-26 LAB — HERPES SIMPLEX VIRUS CULTURE
CULTURE: NOT DETECTED
Special Requests: NORMAL

## 2013-03-27 ENCOUNTER — Telehealth (HOSPITAL_COMMUNITY): Payer: Self-pay | Admitting: *Deleted

## 2013-03-27 NOTE — ED Notes (Signed)
GC pos., Chlamydia neg., Affirm: all neg., Herpes culture: No herpes simplex detected.  I called pt. Pt. verified x 2 and given results.  Pt. told she needs to come back in for a shot of Rocephin and Zithromax pills.  Pt. instructed to notify her partner, no sex for 1 week and to practice safe sex. [facility]. [facility] by appointment.  Pt. said she is going to try and come in AM tomorrow before she goes to work. Vassie MoselleYork, Oluwademilade Kellett M 03/27/2013

## 2013-03-30 NOTE — ED Notes (Signed)
Left message on pt.'s VM that she has not come in for her medication. Pt. instructed to call and let me know she got my messages ( 1/21 and 1/23)  and when she plans to come. Call 3. 03/30/2013

## 2013-04-03 ENCOUNTER — Encounter (HOSPITAL_COMMUNITY): Payer: Self-pay | Admitting: Emergency Medicine

## 2013-04-03 ENCOUNTER — Emergency Department (INDEPENDENT_AMBULATORY_CARE_PROVIDER_SITE_OTHER)
Admission: EM | Admit: 2013-04-03 | Discharge: 2013-04-03 | Disposition: A | Discharge status: Home / Self Care | Payer: Self-pay | Source: Home / Self Care | Attending: Family Medicine | Admitting: Family Medicine

## 2013-04-03 DIAGNOSIS — N771 Vaginitis, vulvitis and vulvovaginitis in diseases classified elsewhere: Secondary | ICD-10-CM

## 2013-04-03 DIAGNOSIS — N72 Inflammatory disease of cervix uteri: Secondary | ICD-10-CM

## 2013-04-03 DIAGNOSIS — A5609 Other chlamydial infection of lower genitourinary tract: Secondary | ICD-10-CM

## 2013-04-03 DIAGNOSIS — A5602 Chlamydial vulvovaginitis: Secondary | ICD-10-CM

## 2013-04-03 MED ORDER — LIDOCAINE HCL (PF) 1 % IJ SOLN
INTRAMUSCULAR | Status: AC
  Filled 2013-04-03: qty 5

## 2013-04-03 MED ORDER — CEFTRIAXONE SODIUM 250 MG IJ SOLR
INTRAMUSCULAR | Status: AC
  Filled 2013-04-03: qty 250

## 2013-04-03 MED ORDER — AZITHROMYCIN 250 MG PO TABS
ORAL_TABLET | ORAL | Status: AC
  Filled 2013-04-03: qty 4

## 2013-04-03 MED ORDER — CEFTRIAXONE SODIUM 1 G IJ SOLR
250.0000 mg | Freq: Once | INTRAMUSCULAR | Status: AC
  Administered 2013-04-03: 250 mg via INTRAMUSCULAR

## 2013-04-03 MED ORDER — AZITHROMYCIN 250 MG PO TABS
1000.0000 mg | ORAL_TABLET | Freq: Once | ORAL | Status: AC
  Administered 2013-04-03: 1000 mg via ORAL

## 2013-04-03 NOTE — ED Notes (Signed)
Pt stated that she got a call from the nurse telling her to come in for treatment. Written by: Marga MelnickQuaNeisha Jones, SMA

## 2013-04-03 NOTE — ED Notes (Signed)
DHHS form [facility]. Vassie MoselleYork, Wilfredo Canterbury M 04/03/2013

## 2013-04-03 NOTE — ED Notes (Signed)
Pt states she can not wait b/c her transportation/ride is waiting outside and can not wait any longer Pt verb understanding

## 2013-04-03 NOTE — ED Provider Notes (Signed)
CSN: [id number]     Arrival date & time 04/03/13  1041 History   First MD Initiated Contact with Patient 04/03/13 1122     Chief Complaint  Patient presents with  . Exposure to STD   (Consider location/radiation/quality/duration/timing/severity/associated sxs/prior Treatment) Patient is a 23 y.o. female presenting with STD exposure. The history is provided by the patient.  Exposure to STD This is a new problem. Episode onset: seen and eval 1/17, and tests are pos for chlamydia, here for rx.    History reviewed. No pertinent past medical history. History reviewed. No pertinent past surgical history. History reviewed. No pertinent family history. History  Substance Use Topics  . Smoking status: Never Smoker   . Smokeless tobacco: Not on file  . Alcohol Use: No   OB History   Grav Para Term Preterm Abortions TAB SAB Ect Mult Living                 Review of Systems  Allergies  Amoxicillin  Home Medications   Current Outpatient Rx  Name  Route  Sig  Dispense  Refill  . acetaminophen (TYLENOL) 500 MG tablet   Oral   Take 500 mg by mouth daily as needed for moderate pain.         Marland Kitchen. aspirin 325 MG tablet   Oral   Take 650 mg by mouth daily as needed for moderate pain.          BP 121/72  Pulse 72  Temp(Src) 98.4 F (36.9 C) (Oral)  Resp 18  SpO2 100%  LMP 04/03/2013 Physical Exam  ED Course  Procedures (including critical care time) Labs Review Labs Reviewed - No data to display Imaging Review No results found.  EKG Interpretation    Date/Time:    Ventricular Rate:    PR Interval:    QRS Duration:   QT Interval:    QTC Calculation:   R Axis:     Text Interpretation:              MDM  Chlamydia vaginitis     Linna HoffJames D Arabel Barcenas, MD 04/03/13 1215

## 2013-06-30 ENCOUNTER — Inpatient Hospital Stay (HOSPITAL_COMMUNITY)
Admission: AD | Admit: 2013-06-30 | Discharge: 2013-06-30 | Disposition: A | Discharge status: Home / Self Care | Payer: Medicaid Other | Source: Ambulatory Visit | Attending: Obstetrics & Gynecology | Admitting: Obstetrics & Gynecology

## 2013-06-30 ENCOUNTER — Encounter (HOSPITAL_COMMUNITY): Payer: Self-pay

## 2013-06-30 DIAGNOSIS — Z3201 Encounter for pregnancy test, result positive: Secondary | ICD-10-CM | POA: Diagnosis not present

## 2013-06-30 DIAGNOSIS — Z3200 Encounter for pregnancy test, result unknown: Secondary | ICD-10-CM | POA: Diagnosis present

## 2013-06-30 LAB — POCT PREGNANCY, URINE: Preg Test, Ur: POSITIVE — AB

## 2013-06-30 NOTE — Discharge Instructions (Signed)

## 2013-06-30 NOTE — MAU Note (Signed)
Here for pregnancy verification letter only. Denies pain or bleeding.

## 2013-07-10 ENCOUNTER — Inpatient Hospital Stay (HOSPITAL_COMMUNITY): Payer: Medicaid Other

## 2013-07-10 ENCOUNTER — Encounter (HOSPITAL_COMMUNITY): Payer: Self-pay | Admitting: *Deleted

## 2013-07-10 ENCOUNTER — Inpatient Hospital Stay (HOSPITAL_COMMUNITY)
Admission: AD | Admit: 2013-07-10 | Discharge: 2013-07-10 | Disposition: A | Discharge status: Home / Self Care | Payer: Medicaid Other | Source: Ambulatory Visit | Attending: Obstetrics & Gynecology | Admitting: Obstetrics & Gynecology

## 2013-07-10 DIAGNOSIS — O99891 Other specified diseases and conditions complicating pregnancy: Secondary | ICD-10-CM | POA: Insufficient documentation

## 2013-07-10 DIAGNOSIS — O9989 Other specified diseases and conditions complicating pregnancy, childbirth and the puerperium: Secondary | ICD-10-CM

## 2013-07-10 DIAGNOSIS — R109 Unspecified abdominal pain: Secondary | ICD-10-CM

## 2013-07-10 DIAGNOSIS — O26899 Other specified pregnancy related conditions, unspecified trimester: Secondary | ICD-10-CM

## 2013-07-10 LAB — CBC
HEMATOCRIT: 36 % (ref 36.0–46.0)
Hemoglobin: 11.6 g/dL — ABNORMAL LOW (ref 12.0–15.0)
MCH: 26.2 pg (ref 26.0–34.0)
MCHC: 32.2 g/dL (ref 30.0–36.0)
MCV: 81.4 fL (ref 78.0–100.0)
Platelets: 253 10*3/uL (ref 150–400)
RBC: 4.42 MIL/uL (ref 3.87–5.11)
RDW: 13.4 % (ref 11.5–15.5)
WBC: 7.3 10*3/uL (ref 4.0–10.5)

## 2013-07-10 LAB — URINALYSIS, ROUTINE W REFLEX MICROSCOPIC
BILIRUBIN URINE: NEGATIVE
GLUCOSE, UA: NEGATIVE mg/dL
HGB URINE DIPSTICK: NEGATIVE
Ketones, ur: NEGATIVE mg/dL
Leukocytes, UA: NEGATIVE
Nitrite: NEGATIVE
PH: 7 (ref 5.0–8.0)
Protein, ur: NEGATIVE mg/dL
SPECIFIC GRAVITY, URINE: 1.025 (ref 1.005–1.030)
UROBILINOGEN UA: 0.2 mg/dL (ref 0.0–1.0)

## 2013-07-10 LAB — HCG, QUANTITATIVE, PREGNANCY: hCG, Beta Chain, Quant, S: 31323 m[IU]/mL — ABNORMAL HIGH (ref <5)

## 2013-07-10 LAB — WET PREP, GENITAL
CLUE CELLS WET PREP: NONE SEEN
TRICH WET PREP: NONE SEEN
Yeast Wet Prep HPF POC: NONE SEEN

## 2013-07-10 NOTE — MAU Note (Signed)
PT SAYS SHE WAS IN MAU   2  WEEKS AGO- TO CONFIRM PREG.----   UPT-.    SAYS SHE  STARTED HAVING PAIN ON RIGHT   SIDE  ON SAT-    SHARP.    DENIES VAG BLEEDING.    LAST SEX-  Sunday.  THINKS WILL  GET PNC-  AT HD

## 2013-07-10 NOTE — MAU Provider Note (Signed)
Attestation of Attending Supervision of Advanced Practitioner (CNM/NP): Evaluation and management procedures were performed by the Advanced Practitioner under my supervision and collaboration.  I have reviewed the Advanced Practitioner's note and chart, and I agree with the management and plan.  Eber JonesCarolyn [name] 4:57 PM

## 2013-07-10 NOTE — MAU Provider Note (Signed)
Chief Complaint: Abdominal Pain  First Provider Initiated Contact with Patient 07/10/13 1355     SUBJECTIVE HPI: Susan Stone is a 23 y.o. G2P1001 at [redacted]w[redacted]d by LMP who presents with 3 day history of right sided abdominal/flank pain. Pt describes a sharp colicky rt sided flank pain. Denies trauma, heavy lifting, history of kidney stones, vomiting, vaginal bleeding, vaginal discharge, hematuria, dysuria. Last bowel movement 2 days ago which is normal for pt. No diarrhea or constipation   History reviewed. No pertinent past medical history. OB History  Gravida Para Term Preterm AB SAB TAB Ectopic Multiple Living  2 1 1       1     # Outcome Date GA Lbr Len/2nd Weight Sex Delivery Anes PTL Lv  2 CUR           1 TRM     F SVD   Y     History reviewed. No pertinent past surgical history. History   Social History  . Marital Status: Single    Spouse Name: N/A    Number of Children: N/A  . Years of Education: N/A   Occupational History  . Not on file.   Social History Main Topics  . Smoking status: Never Smoker   . Smokeless tobacco: Not on file  . Alcohol Use: No  . Drug Use: Not on file  . Sexual Activity: Yes    Birth Control/ Protection: None   Other Topics Concern  . Not on file   Social History Narrative  . No narrative on file   No current facility-administered medications on file prior to encounter.   No current outpatient prescriptions on file prior to encounter.   Allergies  Allergen Reactions  . Amoxicillin Rash    And shaking    ROS: Pertinent items in HPI  OBJECTIVE Blood pressure 125/76, pulse 84, temperature 99.1 F (37.3 C), temperature source Oral, resp. rate 20, height 5\' 3"  (1.6 m), weight 182 lb (82.555 kg), last menstrual period 05/28/2013, SpO2 100.00%. GENERAL: Well-developed, well-nourished female in no acute distress.  HEENT: Normocephalic HEART: normal rate RESP: normal effort ABDOMEN: Soft, non-tender Musculoskeletal: no CVA  tenderness EXTREMITIES: Nontender, no edema NEURO: Alert and oriented Speculum exam: Vagina - Small amount of creamy discharge, no odor Cervix - No contact bleeding Bimanual exam: Cervix closed, no CMT,  Uterus non tender, gravid Adnexa non tender, no masses bilaterally GC/Chlam, wet prep done Chaperone present for exam.   LAB RESULTS Results for orders placed during the hospital encounter of 07/10/13 (from the past 24 hour(s))  URINALYSIS, ROUTINE W REFLEX MICROSCOPIC     Status: None   Collection Time    07/10/13 12:55 PM      Result Value Ref Range   Color, Urine YELLOW  YELLOW   APPearance CLEAR  CLEAR   Specific Gravity, Urine 1.025  1.005 - 1.030   pH 7.0  5.0 - 8.0   Glucose, UA NEGATIVE  NEGATIVE mg/dL   Hgb urine dipstick NEGATIVE  NEGATIVE   Bilirubin Urine NEGATIVE  NEGATIVE   Ketones, ur NEGATIVE  NEGATIVE mg/dL   Protein, ur NEGATIVE  NEGATIVE mg/dL   Urobilinogen, UA 0.2  0.0 - 1.0 mg/dL   Nitrite NEGATIVE  NEGATIVE   Leukocytes, UA NEGATIVE  NEGATIVE  WET PREP, GENITAL     Status: Abnormal   Collection Time    07/10/13  2:20 PM      Result Value Ref Range   Yeast Wet Prep HPF POC  NONE SEEN  NONE SEEN   Trich, Wet Prep NONE SEEN  NONE SEEN   Clue Cells Wet Prep HPF POC NONE SEEN  NONE SEEN   WBC, Wet Prep HPF POC FEW (*) NONE SEEN  HCG, QUANTITATIVE, PREGNANCY     Status: Abnormal   Collection Time    07/10/13  2:37 PM      Result Value Ref Range   hCG, Beta Chain, Sharene ButtersQuant, [place] [id number] (*) <5 mIU/mL  CBC     Status: Abnormal   Collection Time    07/10/13  2:45 PM      Result Value Ref Range   WBC 7.3  4.0 - 10.5 K/uL   RBC 4.42  3.87 - 5.11 MIL/uL   Hemoglobin 11.6 (*) 12.0 - 15.0 g/dL   HCT 60.436.0  54.036.0 - 98.146.0 %   MCV 81.4  78.0 - 100.0 fL   MCH 26.2  26.0 - 34.0 pg   MCHC 32.2  30.0 - 36.0 g/dL   RDW 19.113.4  47.811.5 - 29.515.5 %   Platelets 253  150 - 400 K/uL    IMAGING [place] Ob Comp Less 14 Wks  07/10/2013   CLINICAL DATA:  Positive pregnancy test,  right-sided pelvic pain for 3 days  EXAM: OBSTETRIC <14 WK US AND TRANSVAGINAL OB US  TECHNIQUE: Both transabdominal and transvaginal ultrasound examinations were performed for complete evaluation of the gestation as well as the maternal uterus, adnexal regions, and pelvic cul-de-sac. Transvaginal technique was performed to assess early pregnancy.  COMPARISON:  None.  FINDINGS: Intrauterine gestational sac: Visualized/normal in shape.  Yolk sac:  Visualized  Embryo:  Visualized  Cardiac Activity: Visualized  Heart Rate:  126 bpm  MSD:  7  mm   6 w   4  d  US EDC: 03/01/14  Maternal uterus/adnexae: The ovaries are normal. Small free fluid noted in the cul-de-sac.  IMPRESSION: Single live intrauterine gestation with concordant measurements by crown-rump length compared to assigned gestational age by LMP of 6 weeks 1 day. No acute abnormality.   Electronically Signed   By: Christiana PellantGretchen  Green M.D.   On: 07/10/2013 16:34   [place] Ob Transvaginal  07/10/2013   CLINICAL DATA:  Positive pregnancy test, right-sided pelvic pain for 3 days  EXAM: OBSTETRIC <14 WK US AND TRANSVAGINAL OB US  TECHNIQUE: Both transabdominal and transvaginal ultrasound examinations were performed for complete evaluation of the gestation as well as the maternal uterus, adnexal regions, and pelvic cul-de-sac. Transvaginal technique was performed to assess early pregnancy.  COMPARISON:  None.  FINDINGS: Intrauterine gestational sac: Visualized/normal in shape.  Yolk sac:  Visualized  Embryo:  Visualized  Cardiac Activity: Visualized  Heart Rate:  126 bpm  MSD:  7  mm   6 w   4  d  US EDC: 03/01/14  Maternal uterus/adnexae: The ovaries are normal. Small free fluid noted in the cul-de-sac.  IMPRESSION: Single live intrauterine gestation with concordant measurements by crown-rump length compared to assigned gestational age by LMP of 6 weeks 1 day. No acute abnormality.   Electronically Signed   By: Christiana PellantGretchen  Green M.D.   On: 07/10/2013 16:34     MAU  COURSE Benign UA G/C Wet Prep   benign Ultrasound  IUP CBC  Benign  ASSESSMENT And Plan  Discharge Home  Abdominal Cramping with IUP 1. Discharge home 2. Tylenol prn for pain control  Discussed options for prenatal care and recommended the health department  Recommended to start prenatal vitamin  Confirmed for pt that zyrtec is safe to use for seasonal allergies       Follow-up Information   [facility]. (As needed, If symptoms worsen)    Contact information:   [address] [id number] [place] [address] [phone]      Schedule an appointment as soon as possible for a visit with [facility].   Contact information:   [address] [place] [address] [phone]       Medication List         cetirizine 10 MG tablet  Commonly known as:  ZYRTEC  Take 10 mg by mouth daily.        I have seen and evaluated the patient with the PA student. I agree with the assessment and plan as written above.   Freddi StarrJulie N Ethier, PA-C 07/10/2013 4:52 PM

## 2013-07-10 NOTE — MAU Note (Signed)
Keep having sharp pains in rt side. Pain comes and goes, started 3 days ago.  Random.   Denies any GI or GU problems.

## 2013-07-10 NOTE — Discharge Instructions (Signed)
Pregnancy, First Trimester °The first trimester is the first 3 months your baby is growing inside you. It is important to follow your doctor's instructions. °HOME CARE  °· Do not smoke. °· Do not drink alcohol. °· Only take medicine as told by your doctor. °· Exercise. °· Eat healthy foods. Eat regular, well-balanced meals. °· You can have sex (intercourse) if there are no other problems with the pregnancy. °· Things that help with morning sickness: °· Eat soda crackers before getting up in the morning. °· Eat 4 to 5 small meals rather than 3 large meals. °· Drink liquids between meals, not during meals. °· Go to all appointments as told. °· Take all vitamins or supplements as told by your doctor. °GET HELP RIGHT AWAY IF:  °· You develop a fever. °· You have a bad smelling fluid that is leaking from your vagina. °· There is bleeding from the vagina. °· You develop severe belly (abdominal) or back pain. °· You throw up (vomit) blood. It may look like coffee grounds. °· You lose more than 2 pounds in a week. °· You gain 5 pounds or more in a week. °· You gain more than 2 pounds in a week and you see puffiness (swelling) in your feet, ankles, or legs. °· You have severe dizziness or pass out (faint). °· You are around people who have German measles, chickenpox, or fifth disease. °· You have a headache, watery poop (diarrhea), pain with peeing (urinating), or cannot breath right. °Document Released: 08/11/2007 Document Revised: 05/17/2011 Document Reviewed: 08/11/2007 °ExitCare® Patient Information ©2014 [employer]. ° °

## 2013-07-11 LAB — GC/CHLAMYDIA PROBE AMP
CT PROBE, AMP APTIMA: NEGATIVE
GC Probe RNA: NEGATIVE

## 2013-08-06 ENCOUNTER — Other Ambulatory Visit (HOSPITAL_COMMUNITY): Payer: Self-pay | Admitting: Nurse Practitioner

## 2013-08-06 DIAGNOSIS — Z3682 Encounter for antenatal screening for nuchal translucency: Secondary | ICD-10-CM

## 2013-08-06 LAB — OB RESULTS CONSOLE HIV ANTIBODY (ROUTINE TESTING): HIV: NONREACTIVE

## 2013-08-06 LAB — OB RESULTS CONSOLE GC/CHLAMYDIA
Chlamydia: NEGATIVE
GC PROBE AMP, GENITAL: NEGATIVE

## 2013-08-06 LAB — OB RESULTS CONSOLE ANTIBODY SCREEN: ANTIBODY SCREEN: NEGATIVE

## 2013-08-06 LAB — OB RESULTS CONSOLE RUBELLA ANTIBODY, IGM: RUBELLA: IMMUNE

## 2013-08-06 LAB — OB RESULTS CONSOLE RPR: RPR: NONREACTIVE

## 2013-08-06 LAB — OB RESULTS CONSOLE ABO/RH: RH TYPE: POSITIVE

## 2013-08-06 LAB — OB RESULTS CONSOLE HEPATITIS B SURFACE ANTIGEN: HEP B S AG: NEGATIVE

## 2013-08-27 ENCOUNTER — Ambulatory Visit (HOSPITAL_COMMUNITY)
Admission: RE | Admit: 2013-08-27 | Discharge: 2013-08-27 | Disposition: A | Discharge status: Home / Self Care | Payer: Medicaid Other | Source: Ambulatory Visit | Attending: Nurse Practitioner | Admitting: Nurse Practitioner

## 2013-08-27 ENCOUNTER — Ambulatory Visit (HOSPITAL_COMMUNITY): Admission: RE | Admit: 2013-08-27 | Payer: Medicaid Other | Source: Ambulatory Visit

## 2013-08-27 ENCOUNTER — Encounter (HOSPITAL_COMMUNITY): Payer: Self-pay

## 2013-08-27 DIAGNOSIS — Z3682 Encounter for antenatal screening for nuchal translucency: Secondary | ICD-10-CM

## 2013-08-27 DIAGNOSIS — Z36 Encounter for antenatal screening of mother: Secondary | ICD-10-CM | POA: Insufficient documentation

## 2013-08-27 NOTE — Progress Notes (Signed)
[facility]  Indication: 23 yr old G2P1001 at 558w0d by sure LMP for first trimester screen.  Findings: 1. Single intrauterine pregnancy. 2. Fetal crown rump length measures 4 days ahead of dating. 3. Normal uterus; no adnexal masses seen. 4. Evaluation of fetal anatomy is limited by early gestational age. 5. The nuchal translucency could not be obtained secondary to fetal position.  Recommendations: 1. Fetus measures 4 days ahead of dating: - patient reports sure of LMP - given only 4 days off with sure LMP recommend keep LMP EDD 2. could not obtain nuchal translucency: - given fetus measuring at top end of range for nuchal translucency recommend quad screen at 15-20 weeks - discussed limitations of screening tests in detecting fetal aneuploidy 3. Recommend fetal anatomic survey at 18-20 weeks  Eulis FosterKristen Piya Mesch, MD

## 2013-09-05 ENCOUNTER — Other Ambulatory Visit (HOSPITAL_COMMUNITY): Payer: Self-pay | Admitting: Family

## 2013-09-05 DIAGNOSIS — Z3689 Encounter for other specified antenatal screening: Secondary | ICD-10-CM

## 2013-09-06 ENCOUNTER — Encounter (HOSPITAL_COMMUNITY): Payer: Self-pay | Admitting: *Deleted

## 2013-09-06 ENCOUNTER — Inpatient Hospital Stay (HOSPITAL_COMMUNITY)
Admission: AD | Admit: 2013-09-06 | Discharge: 2013-09-06 | Disposition: A | Discharge status: Home / Self Care | Payer: Medicaid Other | Source: Ambulatory Visit | Attending: Obstetrics & Gynecology | Admitting: Obstetrics & Gynecology

## 2013-09-06 DIAGNOSIS — O9989 Other specified diseases and conditions complicating pregnancy, childbirth and the puerperium: Principal | ICD-10-CM

## 2013-09-06 DIAGNOSIS — O99891 Other specified diseases and conditions complicating pregnancy: Secondary | ICD-10-CM | POA: Insufficient documentation

## 2013-09-06 DIAGNOSIS — R109 Unspecified abdominal pain: Secondary | ICD-10-CM | POA: Insufficient documentation

## 2013-09-06 DIAGNOSIS — N949 Unspecified condition associated with female genital organs and menstrual cycle: Secondary | ICD-10-CM | POA: Insufficient documentation

## 2013-09-06 HISTORY — DX: Other specified health status: Z78.9

## 2013-09-06 LAB — URINE MICROSCOPIC-ADD ON

## 2013-09-06 LAB — URINALYSIS, ROUTINE W REFLEX MICROSCOPIC
BILIRUBIN URINE: NEGATIVE
Glucose, UA: NEGATIVE mg/dL
Hgb urine dipstick: NEGATIVE
KETONES UR: NEGATIVE mg/dL
Nitrite: NEGATIVE
Protein, ur: NEGATIVE mg/dL
Specific Gravity, Urine: 1.01 (ref 1.005–1.030)
UROBILINOGEN UA: 0.2 mg/dL (ref 0.0–1.0)
pH: 6.5 (ref 5.0–8.0)

## 2013-09-06 MED ORDER — OXYCODONE-ACETAMINOPHEN 5-325 MG PO TABS
1.0000 | ORAL_TABLET | Freq: Once | ORAL | Status: AC
  Administered 2013-09-06: 1 via ORAL
  Filled 2013-09-06: qty 1

## 2013-09-06 NOTE — MAU Note (Signed)
Lower abd & bilateral side pain started this a.m., denies bleeding or discharge.

## 2013-09-06 NOTE — MAU Provider Note (Signed)
  History     CSN: [id number]  Arrival date and time: 09/06/13 [id number]   First Provider Initiated Contact with Patient 09/06/13 [phone]      Chief Complaint  Patient presents with  . Abdominal Pain   HPI Comments: Susan Stone 23 y.o. G2P1001 2530w3d presents to MAU with pains around her pelvic bone. She was at home this morning getting ready to go to work with she started having discomfort. Worse with walking, no medications taken. The pain is actually better now and more like soreness. She denies any fever, nausea, vomiting, diarrhea. [facility] and has an appointment July 29th. +FHT today.   Abdominal Pain Pertinent negatives include no diarrhea, fever, nausea or vomiting.      Past Medical History  Diagnosis Date  . Medical history non-contributory     Past Surgical History  Procedure Laterality Date  . No past surgeries      History reviewed. No pertinent family history.  History  Substance Use Topics  . Smoking status: Never Smoker   . Smokeless tobacco: Not on file  . Alcohol Use: No    Allergies:  Allergies  Allergen Reactions  . Amoxicillin Rash    And shaking    Prescriptions prior to admission  Medication Sig Dispense Refill  . Prenatal Vit-Fe Fumarate-FA (PRENATAL MULTIVITAMIN) TABS tablet Take 1 tablet by mouth daily at 12 noon.        Review of Systems  Constitutional: Negative.  Negative for fever.  Eyes: Negative.   Respiratory: Negative.   Cardiovascular: Negative.   Gastrointestinal: Positive for abdominal pain. Negative for heartburn, nausea, vomiting and diarrhea.  Genitourinary: Negative.   Musculoskeletal: Negative.   Skin: Negative.   Neurological: Negative.   Psychiatric/Behavioral: Negative.    Physical Exam   Blood pressure 113/67, pulse 83, temperature 98.9 F (37.2 C), temperature source Oral, height 5\' 3"  (1.6 m), weight 84.641 kg (186 lb 9.6 oz), last menstrual period 05/28/2013, SpO2 100.00%.  Physical Exam   Constitutional: She is oriented to person, place, and time. She appears well-developed and well-nourished. No distress.  HENT:  Head: Normocephalic and atraumatic.  Eyes: Conjunctivae are normal. Pupils are equal, round, and reactive to light.  Cardiovascular: Normal rate, regular rhythm and normal heart sounds.   Respiratory: Breath sounds normal. She is in respiratory distress. She has no wheezes.  GI: Soft. Bowel sounds are normal. There is tenderness.  Slight tenderness right ligament area  Genitourinary:  Genital:External negative Vaginal:small amount white thick discharge Cervix:closed/ thick Bimanual:slight tenderness right ligament area   Musculoskeletal: Normal range of motion.  Neurological: She is alert and oriented to person, place, and time.  Skin: Skin is warm and dry.  Psychiatric: She has a normal mood and affect. Her behavior is normal. Judgment and thought content normal.    MAU Course  Procedures  MDM Percocet for pain  Assessment and Plan  A: Round Ligament pain  P: Advised maternity belt, warm baths, swimming pool, stretches Follow up with [name] Return to MAU with fever, nausea, vomiting, worsening pain  Zaylan Kissoon, Rubbie BattiestLinda Miller 09/06/2013, 9:10 AM

## 2013-09-06 NOTE — Discharge Instructions (Signed)
Abdominal Pain During Pregnancy °Belly (abdominal) pain is common during pregnancy. Most of the time, it is not a serious problem. Other times, it can be a sign that something is wrong with the pregnancy. Always tell your doctor if you have belly pain. °HOME CARE °Monitor your belly pain for any changes. The following actions may help you feel better: °· Do not have sex (intercourse) or put anything in your vagina until you feel better. °· Rest until your pain stops. °· Drink clear fluids if you feel sick to your stomach (nauseous). Do not eat solid food until you feel better. °· Only take medicine as told by your doctor. °· Keep all doctor visits as told. °GET HELP RIGHT AWAY IF:  °· You are bleeding, leaking fluid, or pieces of tissue come out of your vagina. °· You have more pain or cramping. °· You keep throwing up (vomiting). °· You have pain when you pee (urinate) or have blood in your pee. °· You have a fever. °· You do not feel your baby moving as much. °· You feel very weak or feel like passing out. °· You have trouble breathing, with or without belly pain. °· You have a very bad headache and belly pain. °· You have fluid leaking from your vagina and belly pain. °· You keep having watery poop (diarrhea). °· Your belly pain does not go away after resting, or the pain gets worse. °MAKE SURE YOU:  °· Understand these instructions. °· Will watch your condition. °· Will get help right away if you are not doing well or get worse. °Document Released: 02/10/2009 Document Revised: 10/25/2012 Document Reviewed: 09/21/2012 °ExitCare® Patient Information ©2015 [employer]. This information is not intended to replace advice given to you by your health care provider. Make sure you discuss any questions you have with your health care provider. ° °

## 2013-10-12 ENCOUNTER — Ambulatory Visit (HOSPITAL_COMMUNITY)
Admission: RE | Admit: 2013-10-12 | Discharge: 2013-10-12 | Disposition: A | Discharge status: Home / Self Care | Payer: Medicaid Other | Source: Ambulatory Visit | Attending: Family | Admitting: Family

## 2013-10-12 DIAGNOSIS — Z3689 Encounter for other specified antenatal screening: Secondary | ICD-10-CM | POA: Diagnosis not present

## 2013-10-15 ENCOUNTER — Other Ambulatory Visit (HOSPITAL_COMMUNITY): Payer: Self-pay | Admitting: Family

## 2013-10-15 DIAGNOSIS — Z1389 Encounter for screening for other disorder: Secondary | ICD-10-CM

## 2013-11-17 ENCOUNTER — Inpatient Hospital Stay (HOSPITAL_COMMUNITY)
Admission: EM | Admit: 2013-11-17 | Discharge: 2013-11-17 | Disposition: A | Discharge status: Home / Self Care | Payer: No Typology Code available for payment source | Attending: Obstetrics & Gynecology | Admitting: Obstetrics & Gynecology

## 2013-11-17 ENCOUNTER — Encounter (HOSPITAL_COMMUNITY): Payer: Self-pay | Admitting: Emergency Medicine

## 2013-11-17 DIAGNOSIS — O9989 Other specified diseases and conditions complicating pregnancy, childbirth and the puerperium: Principal | ICD-10-CM

## 2013-11-17 DIAGNOSIS — M545 Low back pain, unspecified: Secondary | ICD-10-CM

## 2013-11-17 DIAGNOSIS — Y92410 Unspecified street and highway as the place of occurrence of the external cause: Secondary | ICD-10-CM | POA: Diagnosis not present

## 2013-11-17 DIAGNOSIS — O99891 Other specified diseases and conditions complicating pregnancy: Secondary | ICD-10-CM | POA: Diagnosis not present

## 2013-11-17 DIAGNOSIS — M549 Dorsalgia, unspecified: Secondary | ICD-10-CM | POA: Insufficient documentation

## 2013-11-17 MED ORDER — CYCLOBENZAPRINE HCL 10 MG PO TABS
5.0000 mg | ORAL_TABLET | Freq: Once | ORAL | Status: AC
  Administered 2013-11-17: 5 mg via ORAL
  Filled 2013-11-17: qty 1

## 2013-11-17 MED ORDER — OXYCODONE-ACETAMINOPHEN 5-325 MG PO TABS: 1.0000 | ORAL_TABLET | Freq: Four times a day (QID) | ORAL | Status: DC | PRN

## 2013-11-17 MED ORDER — ACETAMINOPHEN 500 MG PO TABS
1000.0000 mg | ORAL_TABLET | Freq: Once | ORAL | Status: AC
  Administered 2013-11-17: 1000 mg via ORAL
  Filled 2013-11-17: qty 2

## 2013-11-17 NOTE — Progress Notes (Signed)
Pt refused her x-ray. Concerned about the risk to the baby. Dr. Gwendolyn Grant in to talk with pt.
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
## 2013-11-17 NOTE — Discharge Instructions (Signed)
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
## 2013-11-17 NOTE — ED Notes (Addendum)
[facility] at bedside to transport to [facility], EMTALA form signed.

## 2013-11-17 NOTE — ED Notes (Signed)
Notified [facility] for [facility]

## 2013-11-17 NOTE — Progress Notes (Signed)
Notified that pt is at 4 hour monitoring mark. Will come see and discharge pt
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
## 2013-11-17 NOTE — Progress Notes (Signed)
Spoke with Sharen Hint RN. Pt is waiting for [facility] to be transferred over.

## 2013-11-17 NOTE — Progress Notes (Signed)
Transferred to MAU, [facility] by [facility].

## 2013-11-17 NOTE — ED Notes (Signed)
Notified OB Rapid Response RN 
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
## 2013-11-17 NOTE — ED Notes (Signed)
RAPID OB nurse at bedside.
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
## 2013-11-17 NOTE — Progress Notes (Signed)
Dr. Gwendolyn Grant wants pt transferred to [facility] for further monitoring. He will call Dr. Macon Large to arrange transfer.

## 2013-11-18 NOTE — MAU Provider Note (Signed)
Attestation of Attending Supervision of Advanced Practitioner (PA/CNM/NP): Evaluation and management procedures were performed by the Advanced Practitioner under my supervision and collaboration.  I have reviewed the Advanced Practitioner's note and chart, and I agree with the management and plan.  Zamari Bonsall, MD, FACOG Attending Obstetrician & Gynecologist Faculty Practice, [facility]   

## 2013-11-22 ENCOUNTER — Encounter (HOSPITAL_COMMUNITY): Payer: Self-pay | Admitting: *Deleted

## 2013-11-22 ENCOUNTER — Inpatient Hospital Stay (HOSPITAL_COMMUNITY)
Admission: AD | Admit: 2013-11-22 | Discharge: 2013-11-22 | Disposition: A | Discharge status: Home / Self Care | Payer: Medicaid Other | Source: Ambulatory Visit | Attending: Obstetrics & Gynecology | Admitting: Obstetrics & Gynecology

## 2013-11-22 DIAGNOSIS — J069 Acute upper respiratory infection, unspecified: Secondary | ICD-10-CM | POA: Diagnosis not present

## 2013-11-22 DIAGNOSIS — R51 Headache: Secondary | ICD-10-CM | POA: Diagnosis present

## 2013-11-22 DIAGNOSIS — O99891 Other specified diseases and conditions complicating pregnancy: Secondary | ICD-10-CM | POA: Insufficient documentation

## 2013-11-22 DIAGNOSIS — O9989 Other specified diseases and conditions complicating pregnancy, childbirth and the puerperium: Principal | ICD-10-CM

## 2013-11-22 LAB — URINALYSIS, ROUTINE W REFLEX MICROSCOPIC
BILIRUBIN URINE: NEGATIVE
Glucose, UA: NEGATIVE mg/dL
Ketones, ur: NEGATIVE mg/dL
NITRITE: NEGATIVE
PH: 6.5 (ref 5.0–8.0)
Protein, ur: NEGATIVE mg/dL
SPECIFIC GRAVITY, URINE: 1.015 (ref 1.005–1.030)
Urobilinogen, UA: 0.2 mg/dL (ref 0.0–1.0)

## 2013-11-22 LAB — URINE MICROSCOPIC-ADD ON

## 2013-11-22 MED ORDER — IBUPROFEN 600 MG PO TABS
600.0000 mg | ORAL_TABLET | Freq: Once | ORAL | Status: AC
  Administered 2013-11-22: 600 mg via ORAL
  Filled 2013-11-22: qty 1

## 2013-11-22 MED ORDER — IBUPROFEN 400 MG PO TABS: 400.0000 mg | ORAL_TABLET | Freq: Once | ORAL | Status: DC

## 2013-11-22 NOTE — MAU Provider Note (Signed)
History     CSN: [id number]  Arrival date and time: 11/22/13 0865   First Provider Initiated Contact with Patient 11/22/13 1002      Chief Complaint  Patient presents with  . Headache  . Facial Pain   HPI [name] is a 23 y.o. G2P1001 at [redacted]w[redacted]d who presents to MAU today with complaint of headache and sinus pressure. She states that this started 2 days ago and is off and on. She also has mild cough. She denies sore throat, nasal congestion or fever. She denies sick contacts, abdominal pain, vaginal bleeding, LOF or contractions. She reports good fetal movement.   OB History   Grav Para Term Preterm Abortions TAB SAB Ect Mult Living   Past Medical History  Diagnosis Date  . Medical history non-contributory     Past Surgical History  Procedure Laterality Date  . No past surgeries      Family History  Problem Relation Age of Onset  . Hypertension Maternal Grandmother     History  Substance Use Topics  . Smoking status: Never Smoker   . Smokeless tobacco: Not on file  . Alcohol Use: No    Allergies:  Allergies  Allergen Reactions  . Amoxicillin Rash    And shaking    Prescriptions prior to admission  Medication Sig Dispense Refill  . oxyCODONE-acetaminophen (PERCOCET/ROXICET) 5-325 MG per tablet Take 1-2 tablets by mouth every 6 (six) hours as needed.  15 tablet  0  . Prenatal Vit-Fe Fumarate-FA (PRENATAL MULTIVITAMIN) TABS tablet Take 1 tablet by mouth daily at 12 noon.        Review of Systems  Constitutional: Negative for fever and malaise/fatigue.  HENT: Negative for congestion and sore throat.   Eyes: Negative for blurred vision.  Respiratory: Positive for cough.   Gastrointestinal: Negative for abdominal pain.  Genitourinary:       Neg - vaginal bleeding, discharge, LOF  Neurological: Positive for headaches.   Physical Exam   Blood pressure 121/71, pulse 97, temperature 98.1 F (36.7 C), temperature source Oral,  resp. rate 18, height  (1.6 m), weight 204 lb (92.534 kg), last menstrual period 05/28/2013.  Physical Exam  Constitutional: She is oriented to person, place, and time. She appears well-developed and well-nourished. No distress.  HENT:  Head: Normocephalic.  Right Ear: Tympanic membrane, external ear and ear canal normal.  Left Ear: Tympanic membrane, external ear and ear canal normal.  Nose: Mucosal edema and rhinorrhea present. No epistaxis.  No foreign bodies. Right sinus exhibits no maxillary sinus tenderness and no frontal sinus tenderness. Left sinus exhibits no maxillary sinus tenderness and no frontal sinus tenderness.  Mouth/Throat: Uvula is midline, oropharynx is clear and moist and mucous membranes are normal. No oropharyngeal exudate, posterior oropharyngeal edema, posterior oropharyngeal erythema or tonsillar abscesses.  Cardiovascular: Normal rate.   Respiratory: Effort normal.  Lymphadenopathy:       Head (right side): No submental, no submandibular and no tonsillar adenopathy present.       Head (left side): No submental, no submandibular and no tonsillar adenopathy present.    She has no cervical adenopathy.  Neurological: She is alert and oriented to person, place, and time.  Skin: Skin is warm and dry. No erythema.  Psychiatric: She has a normal mood and affect.   Results for orders placed during the hospital encounter of 11/22/13 (from the past 24  hour(s))  URINALYSIS, ROUTINE W REFLEX MICROSCOPIC     Status: Abnormal   Collection Time    11/22/13  8:37 AM      Result Value Ref Range   Color, Urine YELLOW  YELLOW   APPearance CLEAR  CLEAR   Specific Gravity, Urine 1.015  1.005 - 1.030   pH 6.5  5.0 - 8.0   Glucose, UA NEGATIVE  NEGATIVE mg/dL   Hgb urine dipstick SMALL (*) NEGATIVE   Bilirubin Urine NEGATIVE  NEGATIVE   Ketones, ur NEGATIVE  NEGATIVE mg/dL   Protein, ur NEGATIVE  NEGATIVE mg/dL   Urobilinogen, UA 0.2  0.0 - 1.0 mg/dL   Nitrite NEGATIVE   NEGATIVE   Leukocytes, UA SMALL (*) NEGATIVE  URINE MICROSCOPIC-ADD ON     Status: Abnormal   Collection Time    11/22/13  8:37 AM      Result Value Ref Range   Squamous Epithelial / LPF FEW (*) RARE   WBC, UA 3-6  <3 WBC/hpf   RBC / HPF 7-10  <3 RBC/hpf   Bacteria, UA RARE  RARE   Urine-Other MUCOUS PRESENT      Fetal Monitoring: Baseline: 135 bpm, moderate variability, + accelerations, no decelerations Contractions: none  MAU Course  Procedures None  MDM UA today  Assessment and Plan  A: SIUP at [redacted]w[redacted]d URI, acute viral  P: Discharge home Patient given list of OTC meds safe in pregnancy. Recommended decongestant and Tylenol PRN Advised patient to rest and increased PO hydration as tolerated Patient encouraged to keep scheduled appointment for routine prenatal care with [facility] Patient may return to MAU as needed or if her condition were to change or worsen   Marny Lowenstein, PA-C  11/22/2013, 10:02 AM

## 2013-11-22 NOTE — MAU Provider Note (Signed)
Attestation of Attending Supervision of Advanced Practitioner (CNM/NP): Evaluation and management procedures were performed by the Advanced Practitioner under my supervision and collaboration.  I have reviewed the Advanced Practitioner's note and chart, and I agree with the management and plan.  [name], [name] 5:10 PM     
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
## 2013-11-22 NOTE — Discharge Instructions (Signed)
Upper Respiratory Infection, Adult An upper respiratory infection (URI) is also known as the common cold. It is often caused by a type of germ (virus). Colds are easily spread (contagious). You can pass it to others by kissing, coughing, sneezing, or drinking out of the same glass. Usually, you get better in 1 or 2 weeks.  HOME CARE   Only take medicine as told by your doctor.  Use a warm mist humidifier or breathe in steam from a hot shower.  Drink enough water and fluids to keep your pee (urine) clear or pale yellow.  Get plenty of rest.  Return to work when your temperature is back to normal or as told by your doctor. You may use a face mask and wash your hands to stop your cold from spreading. GET HELP RIGHT AWAY IF:   After the first few days, you feel you are getting worse.  You have questions about your medicine.  You have chills, shortness of breath, or brown or red spit (mucus).  You have yellow or brown snot (nasal discharge) or pain in the face, especially when you bend forward.  You have a fever, puffy (swollen) neck, pain when you swallow, or white spots in the back of your throat.  You have a bad headache, ear pain, sinus pain, or chest pain.  You have a high-pitched whistling sound when you breathe in and out (wheezing).  You have a lasting cough or cough up blood.  You have sore muscles or a stiff neck. MAKE SURE YOU:   Understand these instructions.  Will watch your condition.  Will get help right away if you are not doing well or get worse. Document Released: 08/11/2007 Document Revised: 05/17/2011 Document Reviewed: 05/30/2013 ExitCare Patient Information 2015 [employer]. This information is not intended to replace advice given to you by your health care provider. Make sure you discuss any questions you have with your health care provider.  

## 2013-12-07 ENCOUNTER — Ambulatory Visit (HOSPITAL_COMMUNITY)
Admission: RE | Admit: 2013-12-07 | Discharge: 2013-12-07 | Disposition: A | Discharge status: Home / Self Care | Payer: Medicaid Other | Source: Ambulatory Visit | Attending: Family | Admitting: Family

## 2013-12-07 DIAGNOSIS — Z3A27 27 weeks gestation of pregnancy: Secondary | ICD-10-CM | POA: Insufficient documentation

## 2013-12-07 DIAGNOSIS — Z1389 Encounter for screening for other disorder: Secondary | ICD-10-CM

## 2013-12-07 DIAGNOSIS — Z36 Encounter for antenatal screening of mother: Secondary | ICD-10-CM | POA: Insufficient documentation

## 2014-01-07 ENCOUNTER — Encounter (HOSPITAL_COMMUNITY): Payer: Self-pay | Admitting: *Deleted

## 2014-02-06 LAB — OB RESULTS CONSOLE GBS: GBS: NEGATIVE

## 2014-02-09 ENCOUNTER — Inpatient Hospital Stay (HOSPITAL_COMMUNITY)
Admission: AD | Admit: 2014-02-09 | Discharge: 2014-02-09 | Disposition: A | Discharge status: Home / Self Care | Payer: Medicaid Other | Source: Ambulatory Visit | Attending: Obstetrics & Gynecology | Admitting: Obstetrics & Gynecology

## 2014-02-09 ENCOUNTER — Encounter (HOSPITAL_COMMUNITY): Payer: Self-pay

## 2014-02-09 DIAGNOSIS — F458 Other somatoform disorders: Secondary | ICD-10-CM | POA: Diagnosis not present

## 2014-02-09 DIAGNOSIS — W57XXXA Bitten or stung by nonvenomous insect and other nonvenomous arthropods, initial encounter: Secondary | ICD-10-CM

## 2014-02-09 DIAGNOSIS — O99343 Other mental disorders complicating pregnancy, third trimester: Secondary | ICD-10-CM | POA: Diagnosis not present

## 2014-02-09 DIAGNOSIS — Z3A36 36 weeks gestation of pregnancy: Secondary | ICD-10-CM | POA: Diagnosis not present

## 2014-02-09 DIAGNOSIS — O2693 Pregnancy related conditions, unspecified, third trimester: Secondary | ICD-10-CM

## 2014-02-09 DIAGNOSIS — R21 Rash and other nonspecific skin eruption: Secondary | ICD-10-CM | POA: Diagnosis present

## 2014-02-09 DIAGNOSIS — T63481A Toxic effect of venom of other arthropod, accidental (unintentional), initial encounter: Secondary | ICD-10-CM

## 2014-02-09 NOTE — MAU Note (Signed)
I have red bumps all over my legs. One on L arm and hand. Bumps there for few days. Itches. Red raised bumps. No one else at home has them

## 2014-02-09 NOTE — Discharge Instructions (Signed)
Rash A rash is a change in the color or texture of your skin. There are many different types of rashes. You may have other problems that accompany your rash. CAUSES   Infections.  Allergic reactions. This can include allergies to pets or foods.  Certain medicines.  Exposure to certain chemicals, soaps, or cosmetics.  Heat.  Exposure to poisonous plants.  Tumors, both cancerous and noncancerous. SYMPTOMS   Redness.  Scaly skin.  Itchy skin.  Dry or cracked skin.  Bumps.  Blisters.  Pain. DIAGNOSIS  Your caregiver may do a physical exam to determine what type of rash you have. A skin sample (biopsy) may be taken and examined under a microscope. TREATMENT  Treatment depends on the type of rash you have. Your caregiver may prescribe certain medicines. For serious conditions, you may need to see a skin doctor (dermatologist). HOME CARE INSTRUCTIONS   Avoid the substance that caused your rash.  Do not scratch your rash. This can cause infection.  You may take cool baths to help stop itching.  Only take over-the-counter or prescription medicines as directed by your caregiver.  Keep all follow-up appointments as directed by your caregiver. SEEK IMMEDIATE MEDICAL CARE IF:  You have increasing pain, swelling, or redness.  You have a fever.  You have new or severe symptoms.  You have body aches, diarrhea, or vomiting.  Your rash is not better after 3 days. MAKE SURE YOU:  Understand these instructions.  Will watch your condition.  Will get help right away if you are not doing well or get worse. Document Released: 02/12/2002 Document Revised: 05/17/2011 Document Reviewed: 12/07/2010 [facility] Patient Information 2015 [employer] [place]. This information is not intended to replace advice given to you by your health care provider. Make sure you discuss any questions you have with your health care provider.  Take Zyrtec during the day and Benadryl at night for  itching.

## 2014-02-09 NOTE — MAU Provider Note (Signed)
History     CSN: [id number]  Arrival date and time: 02/09/14 0604   First Provider Initiated Contact with Patient 02/09/14 0827      Chief Complaint  Patient presents with  . Rash   HPI  [name] is a 23 y.o. G2P1001 at 4925w5d who presents to MAU today with complaint of a rash on her lower legs and feet for the last few days. She states it was initially "only a few bumps" and now there are more. The rash is pruritic, but not painful. The patient denies any bleeding or drainage from the area. She denies any others at home with same rash. She denies vaginal bleeding, LOF or fever. She states irregular contractions. States that she was checked in the office last week and per patient was 4 cm. She reports good fetal movement.   OB History    Gravida Para Term Preterm AB TAB SAB Ectopic Multiple Living   2 1 1       1       Past Medical History  Diagnosis Date  . Medical history non-contributory     Past Surgical History  Procedure Laterality Date  . No past surgeries      Family History  Problem Relation Age of Onset  . Hypertension Maternal Grandmother     History  Substance Use Topics  . Smoking status: Never Smoker   . Smokeless tobacco: Never Used  . Alcohol Use: No    Allergies:  Allergies  Allergen Reactions  . Amoxicillin Rash    And shaking    Prescriptions prior to admission  Medication Sig Dispense Refill Last Dose  . oxyCODONE-acetaminophen (PERCOCET/ROXICET) 5-325 MG per tablet Take 1-2 tablets by mouth every 6 (six) hours as needed. 15 tablet 0 11/21/2013 at Unknown time  . Prenatal Vit-Fe Fumarate-FA (PRENATAL MULTIVITAMIN) TABS tablet Take 1 tablet by mouth daily at 12 noon.   11/21/2013 at Unknown time    Review of Systems  Constitutional: Negative for fever and malaise/fatigue.  Gastrointestinal: Negative for abdominal pain.  Genitourinary:       Neg -vaginal bleeding, discharge, LOF  Skin: Positive for itching and rash.   Physical  Exam   Blood pressure 135/74, pulse 91, temperature 97.6 F (36.4 C), resp. rate 18, height 5\' 3"  (1.6 m), weight 225 lb 9.6 oz (102.331 kg), last menstrual period 05/28/2013.  Physical Exam  Constitutional: She is oriented to person, place, and time. She appears well-developed and well-nourished. No distress.  HENT:  Head: Normocephalic.  Cardiovascular: Normal rate.   Respiratory: Effort normal.  GI: Soft. She exhibits no distension. There is no tenderness.  Neurological: She is alert and oriented to person, place, and time.  Skin: Skin is warm and dry. Rash noted. Rash is macular (few 0.5 cm erythematous macular areas noted on the lower legs and feet. No evidence of pustules, drainage or excoriations). No erythema.  Psychiatric: She has a normal mood and affect.  Dilation: 4 (outer os) Effacement (%): Thick Exam by:: Gigner morris RN  Fetal Monitoring: Baseline: 120 bpm, moderate variability, + accelerations, no decelerations Contractions: moderate UI, irregular, few   MAU Course  Procedures None  MDM Discussed rash appearance with Dr. Adrian BlackwaterStinson. Ok for discharge with recommendation of anti-histamines  Assessment and Plan  A: Pruritis rash, possible insect bites  P: Discharge home Patient advised to take Zyrtec and Benadryl PRN for itching Discussed signs and symptoms of concerning rash in pregnancy Patient advised to follow-up with  [name] as scheduled for routine prenatal care Patient may return to MAU as needed or if her condition were to change or worsen   Marny LowensteinJulie N Felipa Laroche, PA-C  02/09/2014, 8:40 AM

## 2014-03-04 ENCOUNTER — Other Ambulatory Visit: Payer: Self-pay | Admitting: Obstetrics & Gynecology

## 2014-03-04 DIAGNOSIS — O480 Post-term pregnancy: Secondary | ICD-10-CM

## 2014-03-07 ENCOUNTER — Ambulatory Visit (HOSPITAL_COMMUNITY)
Admission: RE | Admit: 2014-03-07 | Discharge: 2014-03-07 | Disposition: A | Discharge status: Home / Self Care | Payer: Medicaid Other | Source: Ambulatory Visit | Attending: Obstetrics & Gynecology | Admitting: Obstetrics & Gynecology

## 2014-03-07 ENCOUNTER — Encounter (HOSPITAL_COMMUNITY): Payer: Self-pay

## 2014-03-07 DIAGNOSIS — Z3A40 40 weeks gestation of pregnancy: Secondary | ICD-10-CM | POA: Insufficient documentation

## 2014-03-07 DIAGNOSIS — O480 Post-term pregnancy: Secondary | ICD-10-CM | POA: Insufficient documentation

## 2014-03-08 NOTE — L&D Delivery Note (Signed)
Delivery Note At 3:48 PM a viable female was delivered via Vaginal, Spontaneous Delivery (Presentation: Left Occiput Anterior).  APGAR: 8, 9.   Placenta status: Intact, Spontaneous.  Cord: 3 vessels with the following complications: None.   Mother was at 4123w1d, presented in spontaneous labor.  Anesthesia: Epidural  Episiotomy: None Lacerations: 2nd degree Suture Repair: 3.0 monocryl Est. Blood Loss (mL):  300 ml   Mom to postpartum.  Baby to Couplet care / Skin to Skin.  Mother plans to breastfeed, undecided for contraception.  Tereso NewcomerANYANWU,Ravleen Ries A, MD 03/12/2014, 4:39 PM

## 2014-03-12 ENCOUNTER — Inpatient Hospital Stay (HOSPITAL_COMMUNITY)
Admission: AD | Admit: 2014-03-12 | Discharge: 2014-03-14 | DRG: 774 | Disposition: A | Discharge status: Home / Self Care | Payer: Medicaid Other | Source: Ambulatory Visit | Attending: Obstetrics & Gynecology | Admitting: Obstetrics & Gynecology

## 2014-03-12 ENCOUNTER — Encounter (HOSPITAL_COMMUNITY): Payer: Self-pay | Admitting: *Deleted

## 2014-03-12 ENCOUNTER — Telehealth (HOSPITAL_COMMUNITY): Payer: Self-pay | Admitting: *Deleted

## 2014-03-12 ENCOUNTER — Inpatient Hospital Stay (HOSPITAL_COMMUNITY): Payer: Medicaid Other | Admitting: Anesthesiology

## 2014-03-12 DIAGNOSIS — Z3A41 41 weeks gestation of pregnancy: Secondary | ICD-10-CM | POA: Diagnosis present

## 2014-03-12 DIAGNOSIS — Z881 Allergy status to other antibiotic agents status: Secondary | ICD-10-CM

## 2014-03-12 LAB — CBC
HCT: 33.1 % — ABNORMAL LOW (ref 36.0–46.0)
HCT: 27.4 % — ABNORMAL LOW (ref 36.0–46.0)
Hemoglobin: 10.6 g/dL — ABNORMAL LOW (ref 12.0–15.0)
Hemoglobin: 8.9 g/dL — ABNORMAL LOW (ref 12.0–15.0)
MCH: 24.9 pg — ABNORMAL LOW (ref 26.0–34.0)
MCH: 25 pg — ABNORMAL LOW (ref 26.0–34.0)
MCHC: 32 g/dL (ref 30.0–36.0)
MCHC: 32.5 g/dL (ref 30.0–36.0)
MCV: 77.7 fL — ABNORMAL LOW (ref 78.0–100.0)
MCV: 77 fL — ABNORMAL LOW (ref 78.0–100.0)
Platelets: 247 10*3/uL (ref 150–400)
Platelets: 215 10*3/uL (ref 150–400)
RBC: 4.26 MIL/uL (ref 3.87–5.11)
RBC: 3.56 MIL/uL — ABNORMAL LOW (ref 3.87–5.11)
RDW: 14.6 % (ref 11.5–15.5)
RDW: 14.6 % (ref 11.5–15.5)
WBC: 8.5 10*3/uL (ref 4.0–10.5)
WBC: 13.2 10*3/uL — ABNORMAL HIGH (ref 4.0–10.5)

## 2014-03-12 LAB — DIC (DISSEMINATED INTRAVASCULAR COAGULATION) PANEL
D-Dimer, Quant: 1.78 ug/mL-FEU — ABNORMAL HIGH (ref 0.00–0.48)
INR: 1.15 (ref 0.00–1.49)
PLATELETS: 213 10*3/uL (ref 150–400)
Smear Review: NONE SEEN
aPTT: 28 seconds (ref 24–37)

## 2014-03-12 LAB — DIC (DISSEMINATED INTRAVASCULAR COAGULATION)PANEL
Fibrinogen: 388 mg/dL (ref 204–475)
Prothrombin Time: 14.8 seconds (ref 11.6–15.2)

## 2014-03-12 LAB — RPR

## 2014-03-12 LAB — ABO/RH: ABO/RH(D): B POS

## 2014-03-12 LAB — POSTPARTUM HEMORRHAGE PROTOCOL (BB NOTIFICATION)

## 2014-03-12 MED ORDER — DOCUSATE SODIUM 100 MG PO CAPS
100.0000 mg | ORAL_CAPSULE | Freq: Two times a day (BID) | ORAL | Status: DC
  Administered 2014-03-13 – 2014-03-14 (×3): 100 mg via ORAL
  Filled 2014-03-12 (×3): qty 1

## 2014-03-12 MED ORDER — CITRIC ACID-SODIUM CITRATE 334-500 MG/5ML PO SOLN: 30.0000 mL | ORAL | Status: DC | PRN

## 2014-03-12 MED ORDER — SENNOSIDES-DOCUSATE SODIUM 8.6-50 MG PO TABS
2.0000 | ORAL_TABLET | ORAL | Status: DC
  Administered 2014-03-12 – 2014-03-14 (×2): 2 via ORAL
  Filled 2014-03-12 (×2): qty 2

## 2014-03-12 MED ORDER — ONDANSETRON HCL 4 MG/2ML IJ SOLN
4.0000 mg | INTRAMUSCULAR | Status: DC | PRN
Start: 2014-03-12 — End: 2014-03-14

## 2014-03-12 MED ORDER — METHYLERGONOVINE MALEATE 0.2 MG/ML IJ SOLN
INTRAMUSCULAR | Status: AC
Start: 2014-03-12 — End: 2014-03-13
  Filled 2014-03-12: qty 3

## 2014-03-12 MED ORDER — OXYTOCIN 40 UNITS IN LACTATED RINGERS INFUSION - SIMPLE MED
INTRAVENOUS | Status: AC
  Filled 2014-03-12: qty 1000

## 2014-03-12 MED ORDER — IBUPROFEN 600 MG PO TABS
600.0000 mg | ORAL_TABLET | Freq: Four times a day (QID) | ORAL | Status: DC
  Administered 2014-03-12 – 2014-03-14 (×6): 600 mg via ORAL
  Filled 2014-03-12 (×6): qty 1

## 2014-03-12 MED ORDER — MISOPROSTOL 200 MCG PO TABS
ORAL_TABLET | ORAL | Status: AC
  Administered 2014-03-12: 1000 ug
  Filled 2014-03-12: qty 5

## 2014-03-12 MED ORDER — OXYTOCIN 40 UNITS IN LACTATED RINGERS INFUSION - SIMPLE MED
62.5000 mL/h | INTRAVENOUS | Status: DC
  Administered 2014-03-12 (×2): 62.5 mL/h via INTRAVENOUS
  Filled 2014-03-12: qty 1000

## 2014-03-12 MED ORDER — LACTATED RINGERS IV SOLN: 500.0000 mL | INTRAVENOUS | Status: DC | PRN

## 2014-03-12 MED ORDER — OXYCODONE-ACETAMINOPHEN 5-325 MG PO TABS: 1.0000 | ORAL_TABLET | ORAL | Status: DC | PRN

## 2014-03-12 MED ORDER — TETANUS-DIPHTH-ACELL PERTUSSIS 5-2.5-18.5 LF-MCG/0.5 IM SUSP: 0.5000 mL | Freq: Once | INTRAMUSCULAR | Status: DC

## 2014-03-12 MED ORDER — LANOLIN HYDROUS EX OINT
TOPICAL_OINTMENT | CUTANEOUS | Status: DC | PRN
Start: 2014-03-12 — End: 2014-03-14

## 2014-03-12 MED ORDER — PRENATAL MULTIVITAMIN CH: 1.0000 | ORAL_TABLET | Freq: Every day | ORAL | Status: DC

## 2014-03-12 MED ORDER — FENTANYL 2.5 MCG/ML BUPIVACAINE 1/10 % EPIDURAL INFUSION (WH - ANES)
14.0000 mL/h | INTRAMUSCULAR | Status: DC | PRN
  Administered 2014-03-12: 14 mL/h via EPIDURAL
  Filled 2014-03-12: qty 125

## 2014-03-12 MED ORDER — BENZOCAINE-MENTHOL 20-0.5 % EX AERO
1.0000 "application " | INHALATION_SPRAY | CUTANEOUS | Status: DC | PRN
  Filled 2014-03-12: qty 56

## 2014-03-12 MED ORDER — ACETAMINOPHEN 325 MG PO TABS: 650.0000 mg | ORAL_TABLET | ORAL | Status: DC | PRN

## 2014-03-12 MED ORDER — FENTANYL 2.5 MCG/ML BUPIVACAINE 1/10 % EPIDURAL INFUSION (WH - ANES)
14.0000 mL/h | INTRAMUSCULAR | Status: DC | PRN
  Administered 2014-03-12: 14 mL/h via EPIDURAL

## 2014-03-12 MED ORDER — LACTATED RINGERS IV SOLN
500.0000 mL | Freq: Once | INTRAVENOUS | Status: AC
  Administered 2014-03-12: 500 mL via INTRAVENOUS

## 2014-03-12 MED ORDER — EPHEDRINE 5 MG/ML INJ
10.0000 mg | INTRAVENOUS | Status: DC | PRN
  Filled 2014-03-12: qty 2

## 2014-03-12 MED ORDER — ZOLPIDEM TARTRATE 5 MG PO TABS: 5.0000 mg | ORAL_TABLET | Freq: Every evening | ORAL | Status: DC | PRN

## 2014-03-12 MED ORDER — FENTANYL CITRATE 0.05 MG/ML IJ SOLN
100.0000 ug | INTRAMUSCULAR | Status: DC | PRN
  Administered 2014-03-12: 100 ug via INTRAVENOUS
  Filled 2014-03-12: qty 2

## 2014-03-12 MED ORDER — LACTATED RINGERS IV SOLN
INTRAVENOUS | Status: DC
  Administered 2014-03-12: 15:00:00 via INTRAVENOUS

## 2014-03-12 MED ORDER — DIPHENHYDRAMINE HCL 25 MG PO CAPS: 25.0000 mg | ORAL_CAPSULE | Freq: Four times a day (QID) | ORAL | Status: DC | PRN

## 2014-03-12 MED ORDER — OXYCODONE-ACETAMINOPHEN 5-325 MG PO TABS: 2.0000 | ORAL_TABLET | ORAL | Status: DC | PRN

## 2014-03-12 MED ORDER — LIDOCAINE HCL (PF) 1 % IJ SOLN
INTRAMUSCULAR | Status: DC | PRN
  Administered 2014-03-12: 6 mL
  Administered 2014-03-12: 4 mL

## 2014-03-12 MED ORDER — WITCH HAZEL-GLYCERIN EX PADS: 1.0000 "application " | MEDICATED_PAD | CUTANEOUS | Status: DC | PRN

## 2014-03-12 MED ORDER — PHENYLEPHRINE 40 MCG/ML (10ML) SYRINGE FOR IV PUSH (FOR BLOOD PRESSURE SUPPORT)
80.0000 ug | PREFILLED_SYRINGE | INTRAVENOUS | Status: DC | PRN
  Filled 2014-03-12: qty 2

## 2014-03-12 MED ORDER — ONDANSETRON HCL 4 MG PO TABS: 4.0000 mg | ORAL_TABLET | ORAL | Status: DC | PRN

## 2014-03-12 MED ORDER — METHYLERGONOVINE MALEATE 0.2 MG/ML IJ SOLN
0.2000 mg | Freq: Once | INTRAMUSCULAR | Status: AC
  Administered 2014-03-12: 0.2 mg via INTRAMUSCULAR

## 2014-03-12 MED ORDER — FERROUS SULFATE 325 (65 FE) MG PO TABS
325.0000 mg | ORAL_TABLET | Freq: Three times a day (TID) | ORAL | Status: DC
  Administered 2014-03-13 – 2014-03-14 (×4): 325 mg via ORAL
  Filled 2014-03-12 (×4): qty 1

## 2014-03-12 MED ORDER — SIMETHICONE 80 MG PO CHEW: 80.0000 mg | CHEWABLE_TABLET | ORAL | Status: DC | PRN

## 2014-03-12 MED ORDER — DIPHENHYDRAMINE HCL 50 MG/ML IJ SOLN: 12.5000 mg | INTRAMUSCULAR | Status: DC | PRN

## 2014-03-12 MED ORDER — PHENYLEPHRINE 40 MCG/ML (10ML) SYRINGE FOR IV PUSH (FOR BLOOD PRESSURE SUPPORT)
80.0000 ug | PREFILLED_SYRINGE | INTRAVENOUS | Status: DC | PRN
  Filled 2014-03-12: qty 2
  Filled 2014-03-12: qty 20

## 2014-03-12 MED ORDER — DIBUCAINE 1 % RE OINT: 1.0000 "application " | TOPICAL_OINTMENT | RECTAL | Status: DC | PRN

## 2014-03-12 MED ORDER — PRENATAL MULTIVITAMIN CH
1.0000 | ORAL_TABLET | Freq: Every day | ORAL | Status: DC
  Administered 2014-03-13: 1 via ORAL
  Filled 2014-03-12: qty 1

## 2014-03-12 MED ORDER — ONDANSETRON HCL 4 MG/2ML IJ SOLN: 4.0000 mg | Freq: Four times a day (QID) | INTRAMUSCULAR | Status: DC | PRN

## 2014-03-12 MED ORDER — LIDOCAINE HCL (PF) 1 % IJ SOLN
30.0000 mL | INTRAMUSCULAR | Status: DC | PRN
  Filled 2014-03-12: qty 30

## 2014-03-12 MED ORDER — OXYTOCIN BOLUS FROM INFUSION: 500.0000 mL | INTRAVENOUS | Status: DC

## 2014-03-12 MED ORDER — MEASLES, MUMPS & RUBELLA VAC ~~LOC~~ INJ: 0.5000 mL | INJECTION | Freq: Once | SUBCUTANEOUS | Status: DC

## 2014-03-12 NOTE — Anesthesia Procedure Notes (Signed)
Epidural Patient location during procedure: OB  Preanesthetic Checklist Completed: patient identified, site marked, surgical consent, pre-op evaluation, timeout performed, IV checked, risks and benefits discussed and monitors and equipment checked  Epidural Patient position: sitting Prep: site prepped and draped and DuraPrep Patient monitoring: continuous pulse ox and blood pressure Approach: midline Location: L3-L4 Injection technique: LOR air  Needle:  Needle type: Tuohy  Needle gauge: 17 G Needle length: 9 cm and 9 Needle insertion depth: 7 cm Catheter type: closed end flexible Catheter size: 19 Gauge Catheter at skin depth: 14 cm Test dose: negative  Assessment Events: blood not aspirated, injection not painful, no injection resistance, negative IV test and no paresthesia  Additional Notes Dosing of Epidural:  1st dose, through catheter .............................................  Xylocaine 40 mg  2nd dose, through catheter, after waiting 3 minutes.........Xylocaine 60 mg    ( 1% Xylo charted as a single dose in Epic Meds for ease of charting; actual dosing was fractionated as above, for saftey's sake)  As each dose occurred, patient was free of IV sx; and patient exhibited no evidence of SA injection.  Patient is more comfortable after epidural dosed. Please see RN's note for documentation of vital signs,and FHR which are stable.  Patient reminded not to try to ambulate with numb legs, and that an RN must be present when she attempts to get up.       

## 2014-03-12 NOTE — Progress Notes (Signed)
Pt needed to void, no cath with code hemorrhage. Performed orthostatic blood pressures, and pt stated that she was not experiencing any dizziness, nausea, or any other s/s that would increase her fall risk. Used stedy to transport to pt to restroom, and used stedy to transport back to bed. Pt tolerated OOB well. Will continue to monitor. Sherald BargeMatthews, Chrisandra Wiemers L

## 2014-03-12 NOTE — Progress Notes (Signed)
Faculty Practice OB/GYN Attending Note  Subjective:  Called to evaluate patient with PPH after SVD earlier today; Code Hemorrhage activated.  On arrival, patient noted to have significant bleeding but was conscious and attentive. Multiple RNs present administering components of the Code Hemorrhage.   Objective:  Blood pressure 97/53, pulse 79, temperature 98.2 F (36.8 C), resp. rate 20, height 5\' 3"  (1.6 m), weight 224 lb (101.606 kg), last menstrual period 05/28/2013, SpO2 100 %, unknown if currently breastfeeding. Gen: NAD Abdomen:  Soft, fundus firm below umbilicus Cervix: Large clot in lower uterine segment and active bleeding noted.  EBL about 700 ml (1000 ml including 300 ml at delivery) Ext: 2+ DTRs, no edema, no cyanosis, negative Homan's sign  Methergine 0.2mg  IM, Cytotec 1000 mcg PR given.  Patient was already receiving IV pitocin infusion. Bleeding noted to subside significantly.   Assessment & Plan:  24 y.o. [id number] s/p SVD with PPH, bleeding improved after bimanual exam and uterotonics Will follow up Code Hemorrhage labs Continue IV fluid bolus   Jaynie CollinsUGONNA  Manas Hickling, MD, FACOG Attending Obstetrician & Gynecologist Faculty Practice, [facility]

## 2014-03-12 NOTE — H&P (Signed)
Obstetric History and Physical (late entry)  Susan Stone is a 24 y.o. [phone] with IUP at 3411w1d presenting for active labor. Patient states she has been having  regular, every 3 minutes contractions, minimal vaginal bleeding, intact membranes, with active fetal movement.    Prenatal Course Source of Care: Health Department Pregnancy complications or risks: Patient Active Problem List   Diagnosis Date Noted  . Spontaneous vaginal delivery 03/12/2014  . Post term pregnancy over 40 weeks   . Amenorrhea 10/06/2012  . Viral gastroenteritis 03/23/2012  . OVERWEIGHT 12/05/2008  . ANEMIA 12/05/2008   She plans to breastfeed She desires no method for postpartum contraception; currently undecided.   Prenatal labs and studies: ABO, Rh: --/--/B POS, B POS (01/05 1510) Antibody: NEG (01/05 1510) Rubella: Immune (06/01 0000) RPR: NON REAC (01/05 1455)  HBsAg: Negative (06/01 0000)  HIV: Non-reactive (06/01 0000)  YNW:GNFAOZHYGBS:Negative (12/02 0000) 1 hr Glucola  112 Genetic screening normal Anatomy US normal  Prenatal Transfer Tool  Maternal Diabetes: No Genetic Screening: Normal Maternal Ultrasounds/Referrals: Normal Fetal Ultrasounds or other Referrals:  None Maternal Substance Abuse:  No Significant Maternal Medications:  None Significant Maternal Lab Results: Lab values include: Group B Strep negative  Past Medical History  Diagnosis Date  . Medical history non-contributory     Past Surgical History  Procedure Laterality Date  . No past surgeries      OB History  Gravida Para Term Preterm AB SAB TAB Ectopic Multiple Living  2 1 1       0 1    # Outcome Date GA Lbr Len/2nd Weight Sex Delivery Anes PTL Lv  1 Term 2011 6860w0d   F Vag-Spont   Y      History   Social History  . Marital Status: Single    Spouse Name: N/A    Number of Children: N/A  . Years of Education: N/A   Social History Main Topics  . Smoking status: Never Smoker   . Smokeless tobacco: Never Used  .  Alcohol Use: No  . Drug Use: No  . Sexual Activity: Yes    Birth Control/ Protection: None   Other Topics Concern  . None   Social History Narrative    Family History  Problem Relation Age of Onset  . Hypertension Maternal Grandmother     Prescriptions prior to admission  Medication Sig Dispense Refill Last Dose  . Prenatal Vit-Fe Fumarate-FA (PRENATAL MULTIVITAMIN) TABS tablet Take 1 tablet by mouth daily at 12 noon.   03/12/2014 at Unknown time    Allergies  Allergen Reactions  . Amoxicillin Rash    And shaking    Review of Systems: Negative except for what is mentioned in HPI.  Physical Exam: BP 136/73 mmHg  Pulse 79  Temp(Src) 98.2 F (36.8 C) (Oral)  Resp 18  Ht 5\' 3"  (1.6 m)  Wt 224 lb (101.606 kg)  BMI 39.69 kg/m2  SpO2 100%  LMP 05/28/2013 GENERAL: Well-developed, well-nourished female in no acute distress.  LUNGS: Clear to auscultation bilaterally.  HEART: Regular rate and rhythm. ABDOMEN: Soft, nontender, nondistended, gravid. EXTREMITIES: Nontender, no edema, 2+ distal pulses. Cervical Exam: Dilatation 7.5cm   Dilated as per MAU RN  Presentation: cephalic FHT:  Baseline rate 135 bpm   Variability moderate  Accelerations present   Decelerations none Contractions: Every 2-3 mins    Assessment : Susan Stone is a 24 y.o. G2P1001 at 2211w1d being admitted for labor.  Plan: Labor: Expectant management.  Augmentation  as needed, per protocol FWB: Reassuring fetal heart tracing.  GBS negative Delivery plan: Hopeful for vaginal delivery  Jaynie Collins, MD, FACOG Attending Obstetrician & Gynecologist Faculty Practice, [facility]

## 2014-03-12 NOTE — Telephone Encounter (Signed)
Preadmission screen  

## 2014-03-12 NOTE — MAU Note (Signed)
Contractions started this morning. No leaking. Small amt of brownish d/c.  Was d4cm when last checeked

## 2014-03-12 NOTE — Anesthesia Preprocedure Evaluation (Signed)
Anesthesia Evaluation  Patient identified by MRN, date of birth, ID band Patient awake    Reviewed: Allergy & Precautions, H&P , Patient's Chart, lab work & pertinent test results  Airway Mallampati: II  TM Distance: >3 FB Neck ROM: full    Dental  (+) Teeth Intact   Pulmonary  breath sounds clear to auscultation        Cardiovascular Rhythm:regular Rate:Normal     Neuro/Psych    GI/Hepatic   Endo/Other    Renal/GU      Musculoskeletal   Abdominal   Peds  Hematology   Anesthesia Other Findings       Reproductive/Obstetrics (+) Pregnancy                             Anesthesia Physical Anesthesia Plan  ASA: III  Anesthesia Plan: Epidural   Post-op Pain Management:    Induction:   Airway Management Planned:   Additional Equipment:   Intra-op Plan:   Post-operative Plan:   Informed Consent: I have reviewed the patients History and Physical, chart, labs and discussed the procedure including the risks, benefits and alternatives for the proposed anesthesia with the patient or authorized representative who has indicated his/her understanding and acceptance.   Dental Advisory Given  Plan Discussed with:   Anesthesia Plan Comments: (Labs checked- platelets confirmed with RN in room. Fetal heart tracing, per RN, reported to be stable enough for sitting procedure. Discussed epidural, and patient consents to the procedure:  included risk of possible headache,backache, failed block, allergic reaction, and nerve injury. This patient was asked if she had any questions or concerns before the procedure started.)        Anesthesia Quick Evaluation  

## 2014-03-13 NOTE — Progress Notes (Signed)
Post Partum Day 1 Subjective: no complaints, up ad lib, voiding, tolerating PO and + flatus  Objective: Blood pressure 109/52, pulse 87, temperature 98 F (36.7 C), temperature source Oral, resp. rate 18, height 5\' 3"  (1.6 m), weight 101.606 kg (224 lb), last menstrual period 05/28/2013, SpO2 100 %, unknown if currently breastfeeding.  Physical Exam:  General: alert, cooperative, no distress and moderately obese Lochia: appropriate Uterine Fundus: firm Incision: n/a DVT Evaluation: No evidence of DVT seen on physical exam.   Recent Labs  03/12/14 1805 03/13/14 0637  HGB 8.9* 7.2*  HCT 27.4* 22.3*    Assessment/Plan: Plan for discharge tomorrow and Contraception considering Nexplanon placement. Breastfeeding.   LOS: 1 day   Susan Stone, Susan Stone D 03/13/2014, 7:44 AM

## 2014-03-13 NOTE — Anesthesia Postprocedure Evaluation (Signed)
  Anesthesia Post-op Note  Patient: Susan Stone  Procedure(s) Performed: * No procedures listed *  Patient Location: Mother/Baby  Anesthesia Type:Epidural  Level of Consciousness: awake, alert , oriented and patient cooperative  Airway and Oxygen Therapy: Patient Spontanous Breathing  Post-op Pain: mild  Post-op Assessment: Post-op Vital signs reviewed, Patient's Cardiovascular Status Stable, Respiratory Function Stable, Patent Airway, No headache, No backache, No residual numbness and No residual motor weakness  Post-op Vital Signs: Reviewed and stable  Last Vitals:  Filed Vitals:   03/13/14 0607  BP: 109/52  Pulse: 87  Temp: 36.7 C  Resp: 18    Complications: No apparent anesthesia complications

## 2014-03-13 NOTE — Lactation Note (Signed)
This note was copied from the chart of Susan Jonn ShinglesKanathea Saiki. Lactation Consultation Note: Initial visit with mom. Mom reports baby is nursing well but she is having some pain during nursing. Attempted to latch baby but she is too sleepy at present Reviewed wide open mouth and keeping the baby close to the breast throughout the feeding. Encouraged mom to page for assist when baby wakes for next feeding. LS 9-10 by RN. BF brochure given with resources for support after DC. No questions at present.   Patient Name: Susan Stone ZOXWR'UToday's Date: 03/13/2014 Reason for consult: Initial assessment   Maternal Data Formula Feeding for Exclusion: No Has patient been taught Hand Expression?: Yes Does the patient have breastfeeding experience prior to this delivery?: Yes  Feeding Feeding Type: Breast Fed  LATCH Score/Interventions Latch: Too sleepy or reluctant, no latch achieved, no sucking elicited.  Audible Swallowing: None  Type of Nipple: Everted at rest and after stimulation  Comfort (Breast/Nipple): Soft / non-tender     Hold (Positioning): Assistance needed to correctly position infant at breast and maintain latch. Intervention(s): Breastfeeding basics reviewed;Skin to skin  LATCH Score: 5  Lactation Tools Discussed/Used     Consult Status Consult Status: Follow-up Date: 03/14/14 Follow-up type: In-patient    Pamelia HoitWeeks, Kandace Elrod D 03/13/2014, 2:10 PM

## 2014-03-13 NOTE — Progress Notes (Signed)
UR chart review completed.  

## 2014-03-14 ENCOUNTER — Inpatient Hospital Stay (HOSPITAL_COMMUNITY): Admission: RE | Admit: 2014-03-14 | Payer: Medicaid Other | Source: Ambulatory Visit

## 2014-03-14 LAB — CBC
HCT: 22.3 % — ABNORMAL LOW (ref 36.0–46.0)
Hemoglobin: 7.2 g/dL — ABNORMAL LOW (ref 12.0–15.0)
MCH: 25 pg — ABNORMAL LOW (ref 26.0–34.0)
MCHC: 32.3 g/dL (ref 30.0–36.0)
MCV: 77.4 fL — AB (ref 78.0–100.0)
PLATELETS: 190 10*3/uL (ref 150–400)
RBC: 2.88 MIL/uL — AB (ref 3.87–5.11)
RDW: 14.5 % (ref 11.5–15.5)
WBC: 10.3 10*3/uL (ref 4.0–10.5)

## 2014-03-14 MED ORDER — FERROUS SULFATE 325 (65 FE) MG PO TABS: 325.0000 mg | ORAL_TABLET | Freq: Two times a day (BID) | ORAL | Status: DC

## 2014-03-14 MED ORDER — IBUPROFEN 600 MG PO TABS: 600.0000 mg | ORAL_TABLET | Freq: Four times a day (QID) | ORAL | Status: DC

## 2014-03-14 NOTE — Lactation Note (Signed)
This note was copied from the chart of Susan Stone. Lactation Consultation Note: Follow up visit with this experienced BF mom. She reports that baby has been feeding well. Assisted mom with pillows and getting positioned. No questions at present. To call prn  Patient Name: Susan Jonn ShinglesKanathea Ninneman [id number]'s Date: 03/14/2014 Reason for consult: Follow-up assessment   Maternal Data Formula Feeding for Exclusion: No Has patient been taught Hand Expression?: Yes Does the patient have breastfeeding experience prior to this delivery?: Yes  Feeding Feeding Type: Breast Fed  LATCH Score/Interventions Latch: Grasps breast easily, tongue down, lips flanged, rhythmical sucking.  Audible Swallowing: A few with stimulation  Type of Nipple: Everted at rest and after stimulation  Comfort (Breast/Nipple): Soft / non-tender     Hold (Positioning): Assistance needed to correctly position infant at breast and maintain latch. Intervention(s): Breastfeeding basics reviewed;Position options;Skin to skin  LATCH Score: 8  Lactation Tools Discussed/Used     Consult Status Consult Status: Complete    Pamelia HoitWeeks, Adren Dollins D 03/14/2014, 9:19 AM

## 2014-03-14 NOTE — Discharge Instructions (Signed)

## 2014-03-14 NOTE — Discharge Summary (Signed)
Obstetric Discharge Summary Reason for Admission: onset of labor Prenatal Procedures: none Intrapartum Procedures: spontaneous vaginal delivery Postpartum Procedures: none Complications-Operative and Postpartum: 2nd degree laceration HEMOGLOBIN  Date Value Ref Range Status  03/13/2014 7.2* 12.0 - 15.0 g/dL Final   HCT  Date Value Ref Range Status  03/13/2014 22.3* 36.0 - 46.0 % Final   Susan Stone is a 24 yo G2P2 who presented in active labor at 3753w1d, and progressed rapidly to deliver a viable female, with epidural anesthesia & a 2nd degree laceration. Patient is breastfeeding, & plans to get Nexplanon for contraception. Physical Exam:  General: alert and cooperative Lochia: appropriate Uterine Fundus: firm Incision: n/a DVT Evaluation: No evidence of DVT seen on physical exam. Negative Homan's sign. No cords or calf tenderness. No significant calf/ankle edema.  Discharge Diagnoses: Term Pregnancy-delivered  Discharge Information: Date: 03/14/2014 Activity: pelvic rest Diet: routine Medications: PNV, Ibuprofen and Iron Condition: stable Instructions: refer to practice specific booklet Discharge to: home Follow-up Information    [facility] [place] Schedule an appointment as soon as possible for a visit in 6 weeks.   Why:  For your postpartum appointment.   Contact information:   [address] [place] [id number] [phone]      Newborn Data: Live born female  Birth Weight: 8 lb 11.2 oz (3946 g) APGAR: 8, 9  Home with mother.  Cam HaiSHAW, KIMBERLY 03/14/2014, 8:55 AM

## 2014-03-16 LAB — TYPE AND SCREEN
ABO/RH(D): B POS
Antibody Screen: NEGATIVE
Unit division: 0
Unit division: 0

## 2014-04-12 ENCOUNTER — Encounter (HOSPITAL_COMMUNITY): Payer: Self-pay | Admitting: *Deleted

## 2014-04-12 ENCOUNTER — Inpatient Hospital Stay (HOSPITAL_COMMUNITY)
Admission: AD | Admit: 2014-04-12 | Discharge: 2014-04-12 | Disposition: A | Discharge status: Home / Self Care | Payer: Medicaid Other | Source: Ambulatory Visit | Attending: Obstetrics & Gynecology | Admitting: Obstetrics & Gynecology

## 2014-04-12 DIAGNOSIS — N939 Abnormal uterine and vaginal bleeding, unspecified: Secondary | ICD-10-CM | POA: Diagnosis present

## 2014-04-12 LAB — URINALYSIS, ROUTINE W REFLEX MICROSCOPIC
Bilirubin Urine: NEGATIVE
Glucose, UA: NEGATIVE mg/dL
KETONES UR: NEGATIVE mg/dL
NITRITE: NEGATIVE
PH: 6 (ref 5.0–8.0)
Protein, ur: NEGATIVE mg/dL
Specific Gravity, Urine: >1.03 — ABNORMAL HIGH (ref 1.005–1.030)
Urobilinogen, UA: 0.2 mg/dL (ref 0.0–1.0)

## 2014-04-12 LAB — CBC
HEMATOCRIT: 29.6 % — AB (ref 36.0–46.0)
HEMOGLOBIN: 9.2 g/dL — AB (ref 12.0–15.0)
MCH: 24 pg — ABNORMAL LOW (ref 26.0–34.0)
MCHC: 31.1 g/dL (ref 30.0–36.0)
MCV: 77.1 fL — AB (ref 78.0–100.0)
PLATELETS: 283 10*3/uL (ref 150–400)
RBC: 3.84 MIL/uL — ABNORMAL LOW (ref 3.87–5.11)
RDW: 15.1 % (ref 11.5–15.5)
WBC: 6.7 10*3/uL (ref 4.0–10.5)

## 2014-04-12 LAB — POCT PREGNANCY, URINE: Preg Test, Ur: NEGATIVE

## 2014-04-12 LAB — URINE MICROSCOPIC-ADD ON

## 2014-04-12 MED ORDER — NORGESTIMATE-ETH ESTRADIOL 0.25-35 MG-MCG PO TABS: 1.0000 | ORAL_TABLET | Freq: Every day | ORAL | Status: DC

## 2014-04-12 NOTE — Discharge Instructions (Signed)

## 2014-04-12 NOTE — MAU Note (Signed)
Vag delivery on 03/12/14, had stopped bleeding - was having brown discharge.  Discharge was more red last night, started having moderate bleeding today, passed some clots.  Denies pain.

## 2014-04-12 NOTE — MAU Provider Note (Signed)
History     CSN: [id number]  Arrival date and time: 04/12/14 1755   First Provider Initiated Contact with Patient 04/12/14 1831      Chief Complaint  Patient presents with  . Vaginal Bleeding   HPI Comments: Susan Stone 24 y.o. [id number] presents to MAU with vaginal bleeding that started today. She has changed her pad 3-4 times today. She denies any symptoms from blood loss.She delivered on 03/12/14 and had a postpartum hemorrhage. Her H/H was 7.2 and 22.3. Since delivery she bled for a few weeks then stopped. She has stayed on her PNV and FeSO4 . Her postpartum visit will be at [facility]. She has not resumed intercourse and is still deciding about birth control.   Vaginal Bleeding      Past Medical History  Diagnosis Date  . Medical history non-contributory     Past Surgical History  Procedure Laterality Date  . No past surgeries      Family History  Problem Relation Age of Onset  . Hypertension Maternal Grandmother     History  Substance Use Topics  . Smoking status: Never Smoker   . Smokeless tobacco: Never Used  . Alcohol Use: No    Allergies:  Allergies  Allergen Reactions  . Amoxicillin Rash    And shaking    Prescriptions prior to admission  Medication Sig Dispense Refill Last Dose  . ferrous sulfate 325 (65 FE) MG tablet Take 1 tablet (325 mg total) by mouth 2 (two) times daily with a meal.  3 04/12/2014 at Unknown time  . ibuprofen (ADVIL,MOTRIN) 600 MG tablet Take 1 tablet (600 mg total) by mouth every 6 (six) hours. 50 tablet 1 04/11/2014 at Unknown time  . Prenatal Vit-Fe Fumarate-FA (PRENATAL MULTIVITAMIN) TABS tablet Take 1 tablet by mouth daily at 12 noon.   04/12/2014 at Unknown time    Review of Systems  Constitutional: Negative.   HENT: Negative.   Eyes: Negative.   Respiratory: Negative.   Cardiovascular: Negative.   Gastrointestinal: Negative.   Genitourinary: Positive for vaginal bleeding.       Vaginal bleeding  Musculoskeletal: Negative.    Skin: Negative.   Neurological: Negative.   Psychiatric/Behavioral: Negative.    Physical Exam   Blood pressure 126/70, pulse 69, temperature 98.5 F (36.9 C), temperature source Oral, resp. rate 18, unknown if currently breastfeeding.  Physical Exam  Constitutional: She is oriented to person, place, and time. She appears well-developed and well-nourished. No distress.  HENT:  Head: Normocephalic and atraumatic.  Eyes: Pupils are equal, round, and reactive to light.  Cardiovascular: Normal rate, regular rhythm and normal heart sounds.   Respiratory: Effort normal and breath sounds normal. No respiratory distress.  GI: Soft. Bowel sounds are normal. She exhibits no distension. There is no tenderness. There is no rebound.  Genitourinary:  Genital: external bloody Vaginal: moderate amount blood Cervix:closed/ thick Bimanual:nontender   Musculoskeletal: Normal range of motion.  Neurological: She is alert and oriented to person, place, and time.  Skin: Skin is warm and dry.  Psychiatric: She has a normal mood and affect. Her behavior is normal. Judgment and thought content normal.   Results for orders placed or performed during the hospital encounter of 04/12/14 (from the past 24 hour(s))  CBC     Status: Abnormal   Collection Time: 04/12/14  6:35 PM  Result Value Ref Range   WBC 6.7 4.0 - 10.5 K/uL   RBC 3.84 (L) 3.87 - 5.11 MIL/uL   Hemoglobin  9.2 (L) 12.0 - 15.0 g/dL   HCT 16.129.6 (L) 09.636.0 - 04.546.0 %   MCV 77.1 (L) 78.0 - 100.0 fL   MCH 24.0 (L) 26.0 - 34.0 pg   MCHC 31.1 30.0 - 36.0 g/dL   RDW 40.915.1 81.111.5 - 91.415.5 %   Platelets 283 150 - 400 K/uL  Pregnancy, urine POC     Status: None   Collection Time: 04/12/14  6:53 PM  Result Value Ref Range   Preg Test, Ur NEGATIVE NEGATIVE     MAU Course  Procedures  MDM Discussed birth control options and she is leaning toward Mirenia IUD Assessment and Plan   A: Vaginal bleeding/ likely menses  P: Start OrthoCyclen today and  until she gets her Cleta AlbertsMirenia IUD Use condoms for one pack Stay on PNV daily [facility] Return to MAU as needed  Carolynn ServeBarefoot, Ethie Curless Miller 04/12/2014, 7:10 PM

## 2014-09-28 ENCOUNTER — Encounter (HOSPITAL_COMMUNITY): Payer: Self-pay | Admitting: *Deleted

## 2014-09-28 ENCOUNTER — Emergency Department (INDEPENDENT_AMBULATORY_CARE_PROVIDER_SITE_OTHER)
Admission: EM | Admit: 2014-09-28 | Discharge: 2014-09-28 | Disposition: A | Discharge status: Home / Self Care | Payer: Medicaid Other | Source: Home / Self Care | Attending: Family Medicine | Admitting: Family Medicine

## 2014-09-28 DIAGNOSIS — J301 Allergic rhinitis due to pollen: Secondary | ICD-10-CM | POA: Diagnosis not present

## 2014-09-28 MED ORDER — IPRATROPIUM BROMIDE 0.06 % NA SOLN: 2.0000 | Freq: Four times a day (QID) | NASAL | Status: DC

## 2014-09-28 NOTE — Discharge Instructions (Signed)
Allergic Rhinitis Start taking one of the following 3 allergy medicines on a daily basis. Allegra 180 mg, Zyrtec 10 mg or Claritin 10 mg. Use Flonase nasal spray daily Start using nasal saline spray frequently during the day For congestion may use Sudafed PE 10 mg one every 4 hours as needed. Ibuprofen 600 mg every 6-8 hours as needed for discomfort.  Allergi start taking Atrovent nasal spray as to record.Allergicc rhinitis is when the mucous membranes in the nose respond to allergens. Allergens are particles in the air that cause your body to have an allergic reaction. This causes you to release allergic antibodies. Through a chain of events, these eventually cause you to release histamine into the blood stream. Although meant to protect the body, it is this release of histamine that causes your discomfort, such as frequent sneezing, congestion, and an itchy, runny nose.  CAUSES  Seasonal allergic rhinitis (hay fever) is caused by pollen allergens that may come from grasses, trees, and weeds. Year-round allergic rhinitis (perennial allergic rhinitis) is caused by allergens such as house dust mites, pet dander, and mold spores.  SYMPTOMS   Nasal stuffiness (congestion).  Itchy, runny nose with sneezing and tearing of the eyes. DIAGNOSIS  Your health care provider can help you determine the allergen or allergens that trigger your symptoms. If you and your health care provider are unable to determine the allergen, skin or blood testing may be used. TREATMENT  Allergic rhinitis does not have a cure, but it can be controlled by:  Medicines and allergy shots (immunotherapy).  Avoiding the allergen. Hay fever may often be treated with antihistamines in pill or nasal spray forms. Antihistamines block the effects of histamine. There are over-the-counter medicines that may help with nasal congestion and swelling around the eyes. Check with your health care provider before taking or giving this  medicine.  If avoiding the allergen or the medicine prescribed do not work, there are many new medicines your health care provider can prescribe. Stronger medicine may be used if initial measures are ineffective. Desensitizing injections can be used if medicine and avoidance does not work. Desensitization is when a patient is given ongoing shots until the body becomes less sensitive to the allergen. Make sure you follow up with your health care provider if problems continue. HOME CARE INSTRUCTIONS It is not possible to completely avoid allergens, but you can reduce your symptoms by taking steps to limit your exposure to them. It helps to know exactly what you are allergic to so that you can avoid your specific triggers. SEEK MEDICAL CARE IF:   You have a fever.  You develop a cough that does not stop easily (persistent).  You have shortness of breath.  You start wheezing.  Symptoms interfere with normal daily activities. Document Released: 11/17/2000 Document Revised: 02/27/2013 Document Reviewed: 10/30/2012 [facility] Patient Information 2015 [place], [place]. This information is not intended to replace advice given to you by your health care provider. Make sure you discuss any questions you have with your health care provider.

## 2014-09-28 NOTE — ED Notes (Signed)
Assessment per D. Mabe, NP 

## 2014-09-28 NOTE — ED Provider Notes (Signed)
CSN: [id number]     Arrival date & time 09/28/14  1304 History   First MD Initiated Contact with Patient 09/28/14 1352     Chief Complaint  Patient presents with  . Nasal Congestion  . Sore Throat   (Consider location/radiation/quality/duration/timing/severity/associated sxs/prior Treatment) HPI Comments: 23 year old female developed nasal congestion, runny nose, sore throat, watery eyes, sneezing and headache for 2 days. She took a couple Zyrtec and did not get better.   History reviewed. No pertinent past medical history. Past Surgical History  Procedure Laterality Date  . No past surgeries     Family History  Problem Relation Age of Onset  . Hypertension Maternal Grandmother    History  Substance Use Topics  . Smoking status: Never Smoker   . Smokeless tobacco: Never Used  . Alcohol Use: No   OB History    Gravida Para Term Preterm AB TAB SAB Ectopic Multiple Living   0 2     Review of Systems  Constitutional: Positive for appetite change. Negative for fever, chills, activity change and fatigue.  HENT: Positive for congestion, postnasal drip, rhinorrhea and sore throat. Negative for facial swelling.   Eyes: Negative.   Respiratory: Negative.   Cardiovascular: Negative.   Gastrointestinal: Negative.   Musculoskeletal: Negative for neck pain and neck stiffness.  Skin: Negative for pallor and rash.  Neurological: Negative.     Allergies  Amoxicillin  Home Medications   Prior to Admission medications   Medication Sig Start Date End Date Taking? Authorizing Provider  ipratropium (ATROVENT) 0.06 % nasal spray Place 2 sprays into both nostrils 4 (four) times daily. 09/28/14   Hayden Rasmussen, NP   BP 122/83 mmHg  Pulse 84  Temp(Src) 98.3 F (36.8 C) (Oral)  Resp 12  SpO2 100%  LMP 09/17/2014 (Approximate)  Breastfeeding? No Physical Exam  Constitutional: She is oriented to person, place, and time. She appears well-developed and well-nourished. No  distress.  HENT:  Mouth/Throat: No oropharyngeal exudate.  Left TM is normal. Right TM is impacted with cerumen. Oropharynx is mostly clear with small amount of clear PND and scant cobblestoning.  Eyes: Conjunctivae and EOM are normal.  Neck: Normal range of motion. Neck supple.  Cardiovascular: Normal rate and regular rhythm.   Pulmonary/Chest: Effort normal and breath sounds normal. No respiratory distress. She has no wheezes.  Musculoskeletal: Normal range of motion. She exhibits no edema.  Lymphadenopathy:    She has no cervical adenopathy.  Neurological: She is alert and oriented to person, place, and time.  Skin: Skin is warm and dry. No rash noted.  Psychiatric: She has a normal mood and affect.  Nursing note and vitals reviewed.   ED Course  Procedures (including critical care time) Labs Review Labs Reviewed - No data to display  Imaging Review No results found.   MDM   1. Allergic rhinitis due to pollen     Claritin 10 mg. Use Flonase nasal spray daily Start using nasal saline spray frequently during the day For congestion may use Sudafed PE 10 mg one every 4 hours as needed. Ibuprofen 600 mg every 6-8 hours as needed for discomfort.  Allergi start taking Atrovent nasal spray as to record   Hayden Rasmussen, NP 09/28/14 [phone]

## 2016-01-20 ENCOUNTER — Encounter (HOSPITAL_COMMUNITY): Payer: Self-pay | Admitting: Emergency Medicine

## 2016-01-20 ENCOUNTER — Emergency Department (HOSPITAL_COMMUNITY)
Admission: EM | Admit: 2016-01-20 | Discharge: 2016-01-20 | Disposition: A | Discharge status: Home / Self Care | Payer: Medicaid Other | Attending: Emergency Medicine | Admitting: Emergency Medicine

## 2016-01-20 DIAGNOSIS — R112 Nausea with vomiting, unspecified: Secondary | ICD-10-CM | POA: Diagnosis not present

## 2016-01-20 DIAGNOSIS — Z79899 Other long term (current) drug therapy: Secondary | ICD-10-CM | POA: Insufficient documentation

## 2016-01-20 DIAGNOSIS — R197 Diarrhea, unspecified: Secondary | ICD-10-CM | POA: Insufficient documentation

## 2016-01-20 DIAGNOSIS — B9789 Other viral agents as the cause of diseases classified elsewhere: Secondary | ICD-10-CM

## 2016-01-20 DIAGNOSIS — J069 Acute upper respiratory infection, unspecified: Secondary | ICD-10-CM | POA: Diagnosis not present

## 2016-01-20 DIAGNOSIS — J029 Acute pharyngitis, unspecified: Secondary | ICD-10-CM | POA: Diagnosis present

## 2016-01-20 LAB — COMPREHENSIVE METABOLIC PANEL
ALBUMIN: 4.6 g/dL (ref 3.5–5.0)
ALT: 12 U/L — ABNORMAL LOW (ref 14–54)
ANION GAP: 5 (ref 5–15)
AST: 16 U/L (ref 15–41)
Alkaline Phosphatase: 68 U/L (ref 38–126)
BUN: 13 mg/dL (ref 6–20)
CHLORIDE: 106 mmol/L (ref 101–111)
CO2: 26 mmol/L (ref 22–32)
Calcium: 9.4 mg/dL (ref 8.9–10.3)
Creatinine, Ser: 0.82 mg/dL (ref 0.44–1.00)
GFR calc Af Amer: >60 mL/min (ref >60)
Glucose, Bld: 104 mg/dL — ABNORMAL HIGH (ref 65–99)
POTASSIUM: 4 mmol/L (ref 3.5–5.1)
Sodium: 137 mmol/L (ref 135–145)
Total Bilirubin: 1.1 mg/dL (ref 0.3–1.2)
Total Protein: 7.7 g/dL (ref 6.5–8.1)

## 2016-01-20 LAB — URINALYSIS, ROUTINE W REFLEX MICROSCOPIC
Bilirubin Urine: NEGATIVE
GLUCOSE, UA: NEGATIVE mg/dL
KETONES UR: NEGATIVE mg/dL
LEUKOCYTES UA: NEGATIVE
NITRITE: NEGATIVE
PH: 5.5 (ref 5.0–8.0)
Protein, ur: NEGATIVE mg/dL
SPECIFIC GRAVITY, URINE: 1.036 — AB (ref 1.005–1.030)

## 2016-01-20 LAB — CBC
HEMATOCRIT: 33.9 % — AB (ref 36.0–46.0)
HEMOGLOBIN: 10.3 g/dL — AB (ref 12.0–15.0)
MCH: 22.2 pg — ABNORMAL LOW (ref 26.0–34.0)
MCHC: 30.4 g/dL (ref 30.0–36.0)
MCV: 72.9 fL — AB (ref 78.0–100.0)
Platelets: 326 10*3/uL (ref 150–400)
RBC: 4.65 MIL/uL (ref 3.87–5.11)
RDW: 15.8 % — AB (ref 11.5–15.5)
WBC: 8.1 10*3/uL (ref 4.0–10.5)

## 2016-01-20 LAB — LIPASE, BLOOD: LIPASE: 24 U/L (ref 11–51)

## 2016-01-20 LAB — I-STAT BETA HCG BLOOD, ED (MC, WL, AP ONLY): I-stat hCG, quantitative: <5 m[IU]/mL (ref <5)

## 2016-01-20 LAB — URINE MICROSCOPIC-ADD ON
BACTERIA UA: NONE SEEN
SQUAMOUS EPITHELIAL / LPF: NONE SEEN
WBC UA: NONE SEEN WBC/hpf (ref 0–5)

## 2016-01-20 LAB — RAPID STREP SCREEN (MED CTR MEBANE ONLY): STREPTOCOCCUS, GROUP A SCREEN (DIRECT): NEGATIVE

## 2016-01-20 MED ORDER — NAPROXEN 375 MG PO TABS: 375.0000 mg | ORAL_TABLET | Freq: Two times a day (BID) | ORAL | 0 refills | Status: DC

## 2016-01-20 MED ORDER — ONDANSETRON 4 MG PO TBDP
4.0000 mg | ORAL_TABLET | Freq: Once | ORAL | Status: AC
  Administered 2016-01-20: 4 mg via ORAL
  Filled 2016-01-20: qty 1

## 2016-01-20 MED ORDER — ONDANSETRON HCL 4 MG PO TABS: 4.0000 mg | ORAL_TABLET | Freq: Four times a day (QID) | ORAL | 0 refills | Status: DC

## 2016-01-20 MED ORDER — CETIRIZINE HCL 10 MG PO TABS: 10.0000 mg | ORAL_TABLET | Freq: Every day | ORAL | 0 refills | Status: DC

## 2016-01-20 MED ORDER — BENZONATATE 100 MG PO CAPS: 100.0000 mg | ORAL_CAPSULE | Freq: Three times a day (TID) | ORAL | 0 refills | Status: DC

## 2016-01-20 NOTE — ED Provider Notes (Signed)
[facility]-EMERGENCY DEPT Provider Note   CSN: [id number] Arrival date & time: 01/20/16  1056     History   Chief Complaint Chief Complaint  Patient presents with  . Sore Throat    Susan Stone is a [age] y.o. female with a past medical history of anemia presenting to the emergency department with one week of sore throat, "loosing her voice" for the past 72 hours as well as vomiting and diarrhea for the last 24 hours. She also reports rhinorrhea with post nasal drip and sputum, cough, chills and generalized body aches. She was given phenergan from her mother's prescription and a generic version of over the counter flu medication this morning which provided her with some relief. She is not up to date on influenza immunization. Patient reports that both her daughters have been coming down with colds, including vomiting and diarrhea. She endorses mild nausea and abdominal cramping (from her current menstrual period) and when actively vomiting. She denies fevers, bloody diarrhea, abdominal pain, trouble swallowing, SOB, chest pain, hemoptysis, or dysuria.  HPI    History reviewed. No pertinent past medical history.  Patient Active Problem List   Diagnosis Date Noted  . OVERWEIGHT 12/05/2008  . ANEMIA 12/05/2008    Past Surgical History:  Procedure Laterality Date  . NO PAST SURGERIES      OB History    Gravida Para Term Preterm AB Living   2 2 2     2    SAB TAB Ectopic Multiple Live Births         0 2       Home Medications    Prior to Admission medications   Medication Sig Start Date End Date Taking? Authorizing Provider  benzonatate (TESSALON) 100 MG capsule Take 1 capsule (100 mg total) by mouth every 8 (eight) hours. 01/20/16   Georgiana ShoreJessica B Gerritt Galentine, PA  cetirizine (ZYRTEC ALLERGY) 10 MG tablet Take 1 tablet (10 mg total) by mouth daily. 01/20/16   Georgiana ShoreJessica B Tymir Terral, PA  ipratropium (ATROVENT) 0.06 % nasal spray Place 2 sprays into both nostrils 4 (four) times  daily. Patient not taking: Reported on 01/20/2016 09/28/14   Hayden Rasmussenavid Mabe, NP  naproxen (NAPROSYN) 375 MG tablet Take 1 tablet (375 mg total) by mouth 2 (two) times daily. 01/20/16   Georgiana ShoreJessica B Hanni Milford, PA    Family History Family History  Problem Relation Age of Onset  . Hypertension Maternal Grandmother     Social History Social History  Substance Use Topics  . Smoking status: Never Smoker  . Smokeless tobacco: Never Used  . Alcohol use No     Allergies   Amoxicillin   Review of Systems Review of Systems  Constitutional: Positive for chills. Negative for diaphoresis and fever.  HENT: Positive for congestion, postnasal drip, rhinorrhea, sore throat and voice change. Negative for ear discharge, ear pain, sinus pain, sinus pressure, sneezing and trouble swallowing.   Eyes: Negative for pain, discharge and redness.  Respiratory: Positive for cough. Negative for chest tightness, shortness of breath, wheezing and stridor.   Cardiovascular: Negative for chest pain, palpitations and leg swelling.  Gastrointestinal: Positive for diarrhea, nausea and vomiting. Negative for abdominal distention, abdominal pain and blood in stool.  Genitourinary: Negative for difficulty urinating, dysuria, flank pain, frequency, hematuria and urgency.  Musculoskeletal: Positive for myalgias. Negative for neck pain and neck stiffness.  Skin: Negative for color change, pallor, rash and wound.  Neurological: Negative for dizziness, weakness, light-headedness, numbness and headaches.  Physical Exam Updated Vital Signs BP 108/82 (BP Location: Right Arm)   Pulse 68   Temp 97.7 F (36.5 C) (Oral)   Resp 14   SpO2 100%   Physical Exam  Constitutional: She is oriented to person, place, and time. She appears well-developed and well-nourished. No distress.  Non-toxic appearing.   HENT:  Head: Normocephalic and atraumatic.  Right Ear: External ear normal.  Left Ear: External ear normal.  Nose: Nose  normal.  Mouth/Throat: No oropharyngeal exudate.  Slight posterior oropharyngeal erythema without edema. Uvula is midline without edema. No PTA. No trismus. No drooling.   Eyes: Conjunctivae and EOM are normal. Pupils are equal, round, and reactive to light. Right eye exhibits no discharge. Left eye exhibits no discharge. No scleral icterus.  Neck: Normal range of motion. Neck supple. No JVD present.  Cardiovascular: Normal rate, regular rhythm, normal heart sounds and intact distal pulses.  Exam reveals no gallop.   No murmur heard. Pulmonary/Chest: Effort normal and breath sounds normal. No stridor. No respiratory distress. She has no wheezes. She has no rales. She exhibits no tenderness.  Abdominal: Soft. Bowel sounds are normal. She exhibits no distension and no mass. There is no tenderness. There is no rebound and no guarding.  Abdomen is soft and non-tender to palpation.   Musculoskeletal: Normal range of motion. She exhibits no edema or deformity.  Lymphadenopathy:    She has no cervical adenopathy.  Neurological: She is alert and oriented to person, place, and time. Coordination normal.  Skin: Skin is warm. Capillary refill takes less than 2 seconds. No rash noted. She is not diaphoretic. No erythema. No pallor.  Psychiatric: She has a normal mood and affect. Her behavior is normal.  Nursing note and vitals reviewed.    ED Treatments / Results  Labs (all labs ordered are listed, but only abnormal results are displayed) Labs Reviewed  COMPREHENSIVE METABOLIC PANEL - Abnormal; Notable for the following:       Result Value   Glucose, Bld 104 (*)    ALT 12 (*)    All other components within normal limits  CBC - Abnormal; Notable for the following:    Hemoglobin 10.3 (*)    HCT 33.9 (*)    MCV 72.9 (*)    MCH 22.2 (*)    RDW 15.8 (*)    All other components within normal limits  URINALYSIS, ROUTINE W REFLEX MICROSCOPIC (NOT AT [facility]) - Abnormal; Notable for the following:     Color, Urine AMBER (*)    APPearance CLOUDY (*)    Specific Gravity, Urine 1.036 (*)    Hgb urine dipstick LARGE (*)    All other components within normal limits  RAPID STREP SCREEN (NOT AT [facility])  CULTURE, GROUP A STREP ([facility])  LIPASE, BLOOD  URINE MICROSCOPIC-ADD ON  I-STAT BETA HCG BLOOD, ED ([facility], [facility], [facility] ONLY)    EKG  EKG Interpretation None       Radiology No results found.  Procedures Procedures (including critical care time)  Medications Ordered in ED Medications  ondansetron (ZOFRAN-ODT) disintegrating tablet 4 mg (4 mg Oral Given 01/20/16 1328)    Initial Impression / Assessment and Plan / ED Course  I have reviewed the triage vital signs and the nursing notes.  Pertinent labs & imaging results that were available during my care of the patient were reviewed by me and considered in my medical decision making (see chart for details).  Clinical Course    25 y/o  with past medical history of anemia presenting to the emergency department with vomiting and diarrhea x one day in addition to sore throat and flu-like symptoms for the past week. History of ill contact with similar symptoms. Rapid strep negative, she is afebrile and her exam is reassuring (no abdominal pain or tenderness on palpation, no pharyngeal exudate or cervical lymphadenopathy, lungs are clear and equal bilaterally, non-toxic appearing).  Abnormal Labs: urine rbc likely due to current menstrual cycle, hgb is consistent with patient's baseline and hx of anemia.  Symptoms are consistent with a viral syndrome. Patient was advised to rest and drink plenty of fluids. Recommend antihistamine for rhinorrhea and post-nasal drip, naproxen for myalgias as needed tessalon pearls for cough and zofran PRN for nausea. Return precautions discussed.   Final Clinical Impressions(s) / ED Diagnoses   Final diagnoses:  Viral URI with cough  Nausea vomiting and diarrhea    New Prescriptions New Prescriptions    BENZONATATE (TESSALON) 100 MG CAPSULE    Take 1 capsule (100 mg total) by mouth every 8 (eight) hours.   CETIRIZINE (ZYRTEC ALLERGY) 10 MG TABLET    Take 1 tablet (10 mg total) by mouth daily.   NAPROXEN (NAPROSYN) 375 MG TABLET    Take 1 tablet (375 mg total) by mouth 2 (two) times daily.     Georgiana Shore, [place] 01/20/16 1612    Shaune Pollack, MD 01/21/16 1045

## 2016-01-20 NOTE — ED Notes (Signed)
Patient made aware of needed urine sample. 

## 2016-01-20 NOTE — ED Triage Notes (Signed)
Pt c/o emesis, sore throat, rhinorrhea, productive cough with yellow mucus, cold chills, abdominal pain, hoarseness onset yesterday. No rash.

## 2016-01-22 LAB — CULTURE, GROUP A STREP (THRC)

## 2016-09-06 ENCOUNTER — Encounter (HOSPITAL_COMMUNITY): Payer: Self-pay | Admitting: Emergency Medicine

## 2016-09-06 ENCOUNTER — Emergency Department (HOSPITAL_COMMUNITY)
Admission: EM | Admit: 2016-09-06 | Discharge: 2016-09-06 | Disposition: A | Discharge status: Home / Self Care | Payer: Medicaid Other | Attending: Emergency Medicine | Admitting: Emergency Medicine

## 2016-09-06 DIAGNOSIS — R112 Nausea with vomiting, unspecified: Secondary | ICD-10-CM | POA: Insufficient documentation

## 2016-09-06 DIAGNOSIS — Z79899 Other long term (current) drug therapy: Secondary | ICD-10-CM | POA: Insufficient documentation

## 2016-09-06 DIAGNOSIS — R1084 Generalized abdominal pain: Secondary | ICD-10-CM | POA: Insufficient documentation

## 2016-09-06 DIAGNOSIS — R197 Diarrhea, unspecified: Secondary | ICD-10-CM | POA: Insufficient documentation

## 2016-09-06 DIAGNOSIS — D649 Anemia, unspecified: Secondary | ICD-10-CM | POA: Insufficient documentation

## 2016-09-06 DIAGNOSIS — E860 Dehydration: Secondary | ICD-10-CM

## 2016-09-06 MED ORDER — ACETAMINOPHEN 325 MG PO TABS
650.0000 mg | ORAL_TABLET | Freq: Once | ORAL | Status: AC
  Administered 2016-09-06: 650 mg via ORAL
  Filled 2016-09-06: qty 2

## 2016-09-06 MED ORDER — ONDANSETRON 8 MG PO TBDP: 8.0000 mg | ORAL_TABLET | Freq: Three times a day (TID) | ORAL | 0 refills | Status: DC | PRN

## 2016-09-06 MED ORDER — ONDANSETRON 4 MG PO TBDP
8.0000 mg | ORAL_TABLET | Freq: Once | ORAL | Status: AC
  Administered 2016-09-06: 8 mg via ORAL
  Filled 2016-09-06: qty 2

## 2016-09-06 NOTE — ED Notes (Signed)
Pt attempted to urine, not able to provide sample. Will retry in 15 minutes.

## 2016-09-06 NOTE — Discharge Instructions (Signed)

## 2016-09-06 NOTE — ED Provider Notes (Signed)
[facility]-EMERGENCY DEPT Provider Note   CSN: [id number] Arrival date & time: 09/06/16  0515     History   Chief Complaint Chief Complaint  Patient presents with  . Vomiting  . Diarrhea  . Abdominal Pain    HPI Susan Stone is a 26 y.o. female.  The history is provided by the patient.  Diarrhea   This is a new problem. The current episode started 3 to 5 hours ago. The problem occurs 2 to 4 times per day. The problem has not changed since onset.The stool consistency is described as watery. There has been no fever. Associated symptoms include abdominal pain, vomiting and chills. Pertinent negatives include no cough.  Abdominal Pain   Associated symptoms include diarrhea and vomiting. Pertinent negatives include dysuria.  pt presents with 2 episodes of vomiting and 3 episodes of nonbloody loose/watery diarrhea She reports abdominal cramping No fever No known sick contacts She had otherwise been well   PMH - none Soc hx - no travel  Patient Active Problem List   Diagnosis Date Noted  . OVERWEIGHT 12/05/2008  . ANEMIA 12/05/2008    Past Surgical History:  Procedure Laterality Date  . NO PAST SURGERIES      OB History    Gravida Para Term Preterm AB Living   2 2 2     2    SAB TAB Ectopic Multiple Live Births         0 2       Home Medications    Prior to Admission medications   Medication Sig Start Date End Date Taking? Authorizing Provider  benzonatate (TESSALON) 100 MG capsule Take 1 capsule (100 mg total) by mouth every 8 (eight) hours. 01/20/16   Georgiana Shore, PA-C  cetirizine (ZYRTEC ALLERGY) 10 MG tablet Take 1 tablet (10 mg total) by mouth daily. 01/20/16   Mathews Robinsons B, PA-C  ipratropium (ATROVENT) 0.06 % nasal spray Place 2 sprays into both nostrils 4 (four) times daily. Patient not taking: Reported on 01/20/2016 09/28/14   Hayden Rasmussen, NP  naproxen (NAPROSYN) 375 MG tablet Take 1 tablet (375 mg total) by mouth 2 (two) times daily. 01/20/16    Mathews Robinsons B, PA-C  ondansetron (ZOFRAN) 4 MG tablet Take 1 tablet (4 mg total) by mouth every 6 (six) hours. 01/20/16   Georgiana Shore, PA-C    Family History Family History  Problem Relation Age of Onset  . Hypertension Maternal Grandmother     Social History Social History  Substance Use Topics  . Smoking status: Never Smoker  . Smokeless tobacco: Never Used  . Alcohol use No     Allergies   Amoxicillin   Review of Systems Review of Systems  Constitutional: Positive for chills.  HENT: Negative for sore throat.   Respiratory: Negative for cough.   Gastrointestinal: Positive for abdominal pain, diarrhea and vomiting. Negative for blood in stool.  Genitourinary: Negative for dysuria, vaginal bleeding and vaginal discharge.  All other systems reviewed and are negative.    Physical Exam Updated Vital Signs BP 129/75   Pulse 78   Temp 98.3 F (36.8 C) (Oral)   Resp 20   Ht 1.6 m (5\' 3" )   Wt 94.3 kg (208 lb)   SpO2 100%   BMI 36.85 kg/m   Physical Exam  CONSTITUTIONAL: Well developed/well nourished HEAD: Normocephalic/atraumatic EYES: EOMI/PERRL, no icterus ENMT: Mucous membranes moist NECK: supple no meningeal signs SPINE/BACK:entire spine nontender CV: S1/S2 noted, no murmurs/rubs/gallops noted LUNGS:  Lungs are clear to auscultation bilaterally, no apparent distress ABDOMEN: soft, nontender, no rebound or guarding, bowel sounds noted throughout abdomen GU:no cva tenderness NEURO: Pt is awake/alert/appropriate, moves all extremitiesx4.   EXTREMITIES: pulses normal/equal, full ROM SKIN: warm, color normal PSYCH: no abnormalities of mood noted, alert and oriented to situation  ED Treatments / Results  Labs (all labs ordered are listed, but only abnormal results are displayed) Labs Reviewed  URINALYSIS, ROUTINE W REFLEX MICROSCOPIC  POC URINE PREG, ED    EKG  EKG Interpretation None       Radiology No results  found.  Procedures Procedures    Medications Ordered in ED Medications  ondansetron (ZOFRAN-ODT) disintegrating tablet 8 mg (8 mg Oral Given 09/06/16 0539)  acetaminophen (TYLENOL) tablet 650 mg (650 mg Oral Given 09/06/16 [id number])     Initial Impression / Assessment and Plan / ED Course  I have reviewed the triage vital signs and the nursing notes.   Pt here with nausea/vomiting/diarrhea and abd cramping Suspect viral illness She is taking PO abd soft/nontender, no focal tenderness She requests d/c home She does not want to wait for urine testing We discussed strict ER return precautions   Final Clinical Impressions(s) / ED Diagnoses   Final diagnoses:  Nausea vomiting and diarrhea  Dehydration    New Prescriptions New Prescriptions   ONDANSETRON (ZOFRAN ODT) 8 MG DISINTEGRATING TABLET    Take 1 tablet (8 mg total) by mouth every 8 (eight) hours as needed.     Zadie RhineWickline, Charrie Mcconnon, MD 09/06/16 [phone]

## 2016-09-06 NOTE — ED Notes (Signed)
ED Provider at bedside. 

## 2016-09-06 NOTE — ED Notes (Signed)
Pt understood dc material. NAD Noted. Scripts given at dc. 

## 2016-09-06 NOTE — ED Triage Notes (Signed)
Pt states she has been throwing up this morning and having diarrhea. Vomited approx 2 times and had approx 3 bm. C/o of generalized abdominal pain. No known sick contacts

## 2016-09-21 ENCOUNTER — Inpatient Hospital Stay (INDEPENDENT_AMBULATORY_CARE_PROVIDER_SITE_OTHER): Payer: Medicaid Other | Admitting: Physician Assistant

## 2016-09-28 ENCOUNTER — Emergency Department (HOSPITAL_COMMUNITY): Payer: Self-pay

## 2016-09-28 ENCOUNTER — Encounter (HOSPITAL_COMMUNITY): Payer: Self-pay | Admitting: Emergency Medicine

## 2016-09-28 ENCOUNTER — Emergency Department (HOSPITAL_COMMUNITY)
Admission: EM | Admit: 2016-09-28 | Discharge: 2016-09-28 | Disposition: A | Discharge status: Home / Self Care | Payer: Self-pay | Attending: Emergency Medicine | Admitting: Emergency Medicine

## 2016-09-28 ENCOUNTER — Other Ambulatory Visit (HOSPITAL_COMMUNITY): Payer: Medicaid Other

## 2016-09-28 DIAGNOSIS — Z880 Allergy status to penicillin: Secondary | ICD-10-CM | POA: Insufficient documentation

## 2016-09-28 DIAGNOSIS — K8020 Calculus of gallbladder without cholecystitis without obstruction: Secondary | ICD-10-CM | POA: Insufficient documentation

## 2016-09-28 DIAGNOSIS — K8050 Calculus of bile duct without cholangitis or cholecystitis without obstruction: Secondary | ICD-10-CM | POA: Insufficient documentation

## 2016-09-28 DIAGNOSIS — R1011 Right upper quadrant pain: Secondary | ICD-10-CM

## 2016-09-28 LAB — COMPREHENSIVE METABOLIC PANEL
ALK PHOS: 76 U/L (ref 38–126)
ALT: 11 U/L — AB (ref 14–54)
AST: 16 U/L (ref 15–41)
Albumin: 3.9 g/dL (ref 3.5–5.0)
Anion gap: 7 (ref 5–15)
BILIRUBIN TOTAL: 0.5 mg/dL (ref 0.3–1.2)
BUN: 15 mg/dL (ref 6–20)
CALCIUM: 8.9 mg/dL (ref 8.9–10.3)
CO2: 25 mmol/L (ref 22–32)
CREATININE: 0.82 mg/dL (ref 0.44–1.00)
Chloride: 104 mmol/L (ref 101–111)
Glucose, Bld: 122 mg/dL — ABNORMAL HIGH (ref 65–99)
Potassium: 3.9 mmol/L (ref 3.5–5.1)
Sodium: 136 mmol/L (ref 135–145)
Total Protein: 6.5 g/dL (ref 6.5–8.1)

## 2016-09-28 LAB — CBC
HCT: 32.3 % — ABNORMAL LOW (ref 36.0–46.0)
Hemoglobin: 9.4 g/dL — ABNORMAL LOW (ref 12.0–15.0)
MCH: 21.5 pg — ABNORMAL LOW (ref 26.0–34.0)
MCHC: 29.1 g/dL — ABNORMAL LOW (ref 30.0–36.0)
MCV: 73.9 fL — AB (ref 78.0–100.0)
PLATELETS: 290 10*3/uL (ref 150–400)
RBC: 4.37 MIL/uL (ref 3.87–5.11)
RDW: 16.8 % — AB (ref 11.5–15.5)
WBC: 8.2 10*3/uL (ref 4.0–10.5)

## 2016-09-28 LAB — URINALYSIS, ROUTINE W REFLEX MICROSCOPIC
Bilirubin Urine: NEGATIVE
GLUCOSE, UA: NEGATIVE mg/dL
HGB URINE DIPSTICK: NEGATIVE
KETONES UR: NEGATIVE mg/dL
LEUKOCYTES UA: NEGATIVE
Nitrite: NEGATIVE
PROTEIN: NEGATIVE mg/dL
Specific Gravity, Urine: 1.026 (ref 1.005–1.030)
pH: 5 (ref 5.0–8.0)

## 2016-09-28 LAB — I-STAT BETA HCG BLOOD, ED (MC, WL, AP ONLY): I-stat hCG, quantitative: <5 m[IU]/mL (ref <5)

## 2016-09-28 LAB — LIPASE, BLOOD: Lipase: 29 U/L (ref 11–51)

## 2016-09-28 MED ORDER — MORPHINE SULFATE (PF) 4 MG/ML IV SOLN
4.0000 mg | Freq: Once | INTRAVENOUS | Status: AC
  Administered 2016-09-28: 4 mg via INTRAVENOUS
  Filled 2016-09-28: qty 1

## 2016-09-28 MED ORDER — GI COCKTAIL ~~LOC~~
30.0000 mL | Freq: Once | ORAL | Status: AC
  Administered 2016-09-28: 30 mL via ORAL
  Filled 2016-09-28: qty 30

## 2016-09-28 MED ORDER — IOPAMIDOL (ISOVUE-300) INJECTION 61%
INTRAVENOUS | Status: AC
  Administered 2016-09-28: 100 mL
  Filled 2016-09-28: qty 100

## 2016-09-28 MED ORDER — HYDROCODONE-ACETAMINOPHEN 5-325 MG PO TABS: 1.0000 | ORAL_TABLET | Freq: Four times a day (QID) | ORAL | 0 refills | Status: DC | PRN

## 2016-09-28 MED ORDER — OXYCODONE-ACETAMINOPHEN 5-325 MG PO TABS
1.0000 | ORAL_TABLET | Freq: Once | ORAL | Status: AC
  Administered 2016-09-28: 1 via ORAL
  Filled 2016-09-28: qty 1

## 2016-09-28 NOTE — ED Notes (Signed)
Returned from [facility]

## 2016-09-28 NOTE — Discharge Instructions (Signed)
You have a gallstone in your gallbladder.This may be causing some of your pain. Avoid fatty foods, meats and cheeses to see if your symptoms will improve. You are given phone number for general surgery to follow-up with.   Please return without fail for worsening symptoms, including fever, escalating pain, intractable vomiting or any other symptoms concerning to you.

## 2016-09-28 NOTE — ED Triage Notes (Signed)
Pt presents to ED for right sided abdominal pain starting yesterday.  Denies n/v/d.  LMP - 09/22/16.  States pain "just started" yesterday, unable to give details about what.  Pt has taken 600mg  of ibuprofen and 2 tylenol without relief.

## 2016-09-28 NOTE — ED Notes (Signed)
Patient transported to CT 

## 2016-09-28 NOTE — ED Provider Notes (Signed)
Please see previous physicians note regarding patient's presenting history and physical, initial ED course, and associated medical decision making.  In short, this is 26 year old female who presents with epigastric abdominal pain. Pending CT abdomen/pelvis at time of sign out. Blood work reviewed and reassuring.   CT abdomen and pelvis visualized. There is some free fluid in the abdomen, likely related to right-sided corpus luteum cyst. She has evidence of possible gallstones but no inflammatory changes noted.  On my abdominal exam, she has a soft and nonsurgical abdomen. Her pain primarily is localized to the right upper quadrant. No tenderness at McBurney's point. Not suspicious for appendicitis. Plan to obtain a right upper quadrant ultrasound to evaluate the gallbladder in more detail. Her pain has improved with morphine, but she still has some residual symptoms.  RUQ ultrasound visualized and shows evidence of single gallstone. There are no secondary signs for acute cholecystitis. Given that symptoms are improved, she is appropriate for discharge home for outpatient management. General surgery referrals provided as needed. Strict return and follow-up instructions reviewed. She expressed understanding of all discharge instructions and felt comfortable with the plan of care.    Lavera GuiseLiu, Angelly Spearing Duo, MD 09/28/16 [phone]

## 2016-09-28 NOTE — ED Notes (Signed)
Returned from CT.

## 2016-09-28 NOTE — ED Provider Notes (Signed)
[facility] Provider Note   CSN: [id number] Arrival date & time: 09/28/16  0153  By signing my name below, I, Ny'Kea Lewis, attest that this documentation has been prepared under the direction and in the presence of Dione BoozeGlick, Timia Casselman, MD. Electronically Signed: Karren CobbleNy'Kea Lewis, ED Scribe. 09/28/16. 3:02 AM.  History   Chief Complaint Chief Complaint  Patient presents with  . Abdominal Pain   The history is provided by the patient. No language interpreter was used.    HPI Comments: Susan Stone is a 26 y.o. female with a PMHx of obesity and anemia, who presents to the Emergency Department complaining of sudden onset, gradually worsening right sided abdominal pain that began yesterday, she rates the severity of her pain a 10. Pt reports on 7/2, she was seen with similar symptoms which she reports was intermittent, but she states it resolved but yesterday it returned. No alleviating factors noted. She reports no changes in pain with food intake but with fluid intake she reports exacerbation of her pain. Ibuprofen and Tylenol with no relief. Denies fever, nausea, emesis, or diarrhea.   History reviewed. No pertinent past medical history.  Patient Active Problem List   Diagnosis Date Noted  . OVERWEIGHT 12/05/2008  . ANEMIA 12/05/2008   Past Surgical History:  Procedure Laterality Date  . NO PAST SURGERIES     OB History    Gravida Para Term Preterm AB Living   2 2 2     2    SAB TAB Ectopic Multiple Live Births         0 2     Home Medications    Prior to Admission medications   Medication Sig Start Date End Date Taking? Authorizing Provider  ondansetron (ZOFRAN ODT) 8 MG disintegrating tablet Take 1 tablet (8 mg total) by mouth every 8 (eight) hours as needed. 09/06/16   Zadie RhineWickline, Donald, MD   Family History Family History  Problem Relation Age of Onset  . Hypertension Maternal Grandmother    Social History Social History  Substance Use Topics  . Smoking status: Never  Smoker  . Smokeless tobacco: Never Used  . Alcohol use No   Allergies   Amoxicillin  Review of Systems Review of Systems  Constitutional: Negative for fever.  Gastrointestinal: Positive for abdominal pain. Negative for diarrhea, nausea and vomiting.  All other systems reviewed and are negative.  Physical Exam Updated Vital Signs BP 140/90 (BP Location: Right Arm)   Pulse 79   Temp 98 F (36.7 C) (Oral)   Resp 17   Ht 5\' 3"  (1.6 m)   Wt 222 lb (100.7 kg)   SpO2 100%   BMI 39.33 kg/m   Physical Exam  Constitutional: She is oriented to person, place, and time. She appears well-developed and well-nourished.  HENT:  Head: Normocephalic and atraumatic.  Eyes: Pupils are equal, round, and reactive to light. EOM are normal.  Neck: Normal range of motion. Neck supple. No JVD present.  Cardiovascular: Normal rate, regular rhythm and normal heart sounds.   No murmur heard. Pulmonary/Chest: Effort normal and breath sounds normal. She has no wheezes. She has no rales. She exhibits no tenderness.  Abdominal: Soft. Bowel sounds are normal. She exhibits no distension and no mass. There is tenderness.  Mild epigastric tenderness.   Musculoskeletal: Normal range of motion. She exhibits no edema.  Lymphadenopathy:    She has no cervical adenopathy.  Neurological: She is alert and oriented to person, place, and time. No cranial nerve  deficit. She exhibits normal muscle tone. Coordination normal.  Skin: Skin is warm and dry. No rash noted.  Psychiatric: She has a normal mood and affect. Her behavior is normal. Judgment and thought content normal.  Nursing note and vitals reviewed.  ED Treatments / Results  DIAGNOSTIC STUDIES: Oxygen Saturation is 100% on RA, normal by my interpretation.   COORDINATION OF CARE: 2:53 AM-Discussed next steps with pt. Pt verbalized understanding and is agreeable with the plan.   Labs (all labs ordered are listed, but only abnormal results are  displayed) Labs Reviewed  COMPREHENSIVE METABOLIC PANEL - Abnormal; Notable for the following:       Result Value   Glucose, Bld 122 (*)    ALT 11 (*)    All other components within normal limits  CBC - Abnormal; Notable for the following:    Hemoglobin 9.4 (*)    HCT 32.3 (*)    MCV 73.9 (*)    MCH 21.5 (*)    MCHC 29.1 (*)    RDW 16.8 (*)    All other components within normal limits  LIPASE, BLOOD  URINALYSIS, ROUTINE W REFLEX MICROSCOPIC  I-STAT BETA HCG BLOOD, ED ([facility], [facility], [facility] ONLY)   Radiology Study Result Pending   Procedures Procedures (including critical care time)  Medications Ordered in ED Medications - No data to display  Initial Impression / Assessment and Plan / ED Course  I have reviewed the triage vital signs and the nursing notes.  Pertinent labs & imaging results that were available during my care of the patient were reviewed by me and considered in my medical decision making (see chart for details).  Abdominal pain of uncertain cause. Old records reviewed - no relevant visits or imaging studies. Labs significant only for stable anemia. CT scan pending. Case is signed out to Dr. Verdie Mosher.  Final Clinical Impressions(s) / ED Diagnoses   Final diagnoses:  None    New Prescriptions New Prescriptions   No medications on file   I personally performed the services described in this documentation, which was scribed in my presence. The recorded information has been reviewed and is accurate.       Dione Booze, MD 10/12/16 1359

## 2018-03-22 ENCOUNTER — Encounter (HOSPITAL_COMMUNITY): Payer: Self-pay | Admitting: Emergency Medicine

## 2018-03-22 ENCOUNTER — Ambulatory Visit (HOSPITAL_COMMUNITY)
Admission: EM | Admit: 2018-03-22 | Discharge: 2018-03-22 | Disposition: A | Discharge status: Home / Self Care | Payer: Self-pay | Attending: Family Medicine | Admitting: Family Medicine

## 2018-03-22 DIAGNOSIS — N611 Abscess of the breast and nipple: Secondary | ICD-10-CM | POA: Insufficient documentation

## 2018-03-22 MED ORDER — LIDOCAINE HCL (PF) 1 % IJ SOLN
INTRAMUSCULAR | Status: AC
  Filled 2018-03-22: qty 30

## 2018-03-22 MED ORDER — DOXYCYCLINE HYCLATE 100 MG PO CAPS: 100.0000 mg | ORAL_CAPSULE | Freq: Two times a day (BID) | ORAL | 0 refills | Status: AC

## 2018-03-22 NOTE — ED Notes (Signed)
Some aspects of triage are missing due to issues with Epic.  It is giving me error messages and I am unable to chart medications or travel history.

## 2018-03-22 NOTE — ED Provider Notes (Signed)
[facility]    CSN: [id number] Arrival date & time: 03/22/18  [id number]     History   Chief Complaint Chief Complaint  Patient presents with  . Abscess    HPI Susan Stone is a 28 y.o. female no significant past medical history presenting today for evaluation of an abscess.  Patient states that she has had an abscess to her left breast which is been worsening over the past 2 to 3 days.  She has had a lot of pain and tenderness.  She believed to have some drainage from this area beginning last night.  She has also had multiple small similar lesions.  Denies history of draining in the past.  Has had recurrent small bumps that usually resolve on their own without draining or causing significant pain.  She is tried taking a hot shower.   Abscess  Associated symptoms: no fatigue, no fever, no headaches, no nausea and no vomiting     History reviewed. No pertinent past medical history.  Patient Active Problem List   Diagnosis Date Noted  . OVERWEIGHT 12/05/2008  . ANEMIA 12/05/2008    Past Surgical History:  Procedure Laterality Date  . NO PAST SURGERIES      OB History    Gravida  2   Para  2   Term  2   Preterm      AB      Living  2     SAB      TAB      Ectopic      Multiple  0   Live Births  2            Home Medications    Prior to Admission medications   Medication Sig Start Date End Date Taking? Authorizing Provider  doxycycline (VIBRAMYCIN) 100 MG capsule Take 1 capsule (100 mg total) by mouth 2 (two) times daily for 10 days. 03/22/18 04/01/18  Wieters, Hallie C, PA-C  HYDROcodone-acetaminophen (NORCO/VICODIN) 5-325 MG tablet Take 1-2 tablets by mouth every 6 (six) hours as needed for moderate pain or severe pain. 09/28/16   Lavera GuiseLiu, Dana Duo, MD  ondansetron (ZOFRAN ODT) 8 MG disintegrating tablet Take 1 tablet (8 mg total) by mouth every 8 (eight) hours as needed. 09/06/16   Zadie RhineWickline, Donald, MD  norgestimate-ethinyl estradiol  (ORTHO-CYCLEN, 28,) 0.25-35 MG-MCG tablet Take 1 tablet by mouth daily. 04/12/14 09/28/14  BarefootDoralee Albino, Linda M, NP    Family History Family History  Problem Relation Age of Onset  . Hypertension Maternal Grandmother     Social History Social History   Tobacco Use  . Smoking status: Never Smoker  . Smokeless tobacco: Never Used  Substance Use Topics  . Alcohol use: No  . Drug use: No     Allergies   Amoxicillin   Review of Systems Review of Systems  Constitutional: Negative for fatigue and fever.  Eyes: Negative for visual disturbance.  Respiratory: Negative for shortness of breath.   Cardiovascular: Negative for chest pain.  Gastrointestinal: Negative for abdominal pain, nausea and vomiting.  Musculoskeletal: Negative for arthralgias and joint swelling.  Skin: Positive for color change. Negative for rash and wound.  Neurological: Negative for dizziness, weakness, light-headedness and headaches.     Physical Exam Triage Vital Signs ED Triage Vitals [03/22/18 1014]  Enc Vitals Group     BP 119/72     Pulse Rate 71     Resp      Temp 97.9 F (36.6 C)  Temp Source Temporal     SpO2 100 %     Weight      Height      Head Circumference      Peak Flow      Pain Score 10     Pain Loc      Pain Edu?      Excl. in GC?    No data found.  Updated Vital Signs BP 119/72 (BP Location: Right Arm)   Pulse 71   Temp 97.9 F (36.6 C) (Temporal)   LMP 02/25/2018 (Approximate)   SpO2 100%   Visual Acuity Right Eye Distance:   Left Eye Distance:   Bilateral Distance:    Right Eye Near:   Left Eye Near:    Bilateral Near:     Physical Exam Vitals signs and nursing note reviewed.  Constitutional:      Appearance: She is well-developed.     Comments: No acute distress  HENT:     Head: Normocephalic and atraumatic.     Nose: Nose normal.  Eyes:     Conjunctiva/sclera: Conjunctivae normal.  Neck:     Musculoskeletal: Neck supple.  Cardiovascular:     Rate  and Rhythm: Normal rate.  Pulmonary:     Effort: Pulmonary effort is normal. No respiratory distress.  Abdominal:     General: There is no distension.  Musculoskeletal: Normal range of motion.  Skin:    General: Skin is warm and dry.     Comments: Multiple small erythematous and hyperpigmented areas below bilateral breasts, left breast with larger 3 cm area of erythema to approximately 7:00, minimal induration, 1 cm tender indurated lesion inferior to left breast and sub-sternal area  Neurological:     Mental Status: She is alert and oriented to person, place, and time.      UC Treatments / Results  Labs (all labs ordered are listed, but only abnormal results are displayed) Labs Reviewed - No data to display  EKG None  Radiology No results found.  Procedures Incision and Drainage Date/Time: 03/22/2018 10:56 AM Performed by: Wieters, Junius Creamer, PA-C Authorized by: Mardella Layman, MD   Consent:    Consent obtained:  Verbal   Consent given by:  Patient   Risks discussed:  Bleeding, pain and damage to other organs   Alternatives discussed:  No treatment Location:    Type:  Abscess   Size:  3 cm   Location:  Trunk   Trunk location:  L breast Pre-procedure details:    Skin preparation:  Betadine Anesthesia (see MAR for exact dosages):    Anesthesia method:  Local infiltration   Local anesthetic:  Lidocaine 1% w/o epi Procedure type:    Complexity:  Simple Procedure details:    Incision types:  Single straight   Incision depth:  Subcutaneous   Scalpel blade:  11   Wound management:  Probed and deloculated   Drainage:  Bloody and purulent   Drainage amount:  Scant   Wound treatment:  Wound left open   Packing materials:  1/4 in iodoform gauze Post-procedure details:    Patient tolerance of procedure:  Tolerated well, no immediate complications   (including critical care time)  Medications Ordered in UC Medications - No data to display  Initial Impression /  Assessment and Plan / UC Course  I have reviewed the triage vital signs and the nursing notes.  Pertinent labs & imaging results that were available during my care of the patient were  reviewed by me and considered in my medical decision making (see chart for details).     I& D performed, small amount of thicker purulent drainage. Packing placed. Begin doxycycline. Warm compresses.  No immediate complications.  Packing to be removed in 24 to 48 hours.  Continue to monitor for gradual resolution.Discussed strict return precautions. Patient verbalized understanding and is agreeable with plan.  Final Clinical Impressions(s) / UC Diagnoses   Final diagnoses:  Abscess of left breast     Discharge Instructions     Please begin doxycycline for 10 days  Apply warm compresses/hot rags to area with massage to express further drainage especially the first 24-48 hours  Return if symptoms returning or not improving   ED Prescriptions    Medication Sig Dispense Auth. Provider   doxycycline (VIBRAMYCIN) 100 MG capsule Take 1 capsule (100 mg total) by mouth 2 (two) times daily for 10 days. 20 capsule Wieters, Hallie C, PA-C     Controlled Substance Prescriptions [place] Controlled Substance Registry consulted? Not Applicable   Lew DawesWieters, Hallie C, [place]-C 03/22/18 1101

## 2018-03-22 NOTE — Discharge Instructions (Signed)
Please begin doxycycline for 10 days ° °Apply warm compresses/hot rags to area with massage to express further drainage especially the first 24-48 hours ° °Return if symptoms returning or not improving  °

## 2018-03-22 NOTE — ED Triage Notes (Signed)
Pt reports an abscess with some drainage under left breast within the last week and newly developed abscesses between the breasts in the last two days.

## 2018-03-24 ENCOUNTER — Other Ambulatory Visit: Payer: Self-pay

## 2018-03-24 ENCOUNTER — Encounter (HOSPITAL_COMMUNITY): Payer: Self-pay | Admitting: Emergency Medicine

## 2018-03-24 ENCOUNTER — Ambulatory Visit (HOSPITAL_COMMUNITY)
Admission: EM | Admit: 2018-03-24 | Discharge: 2018-03-24 | Disposition: A | Discharge status: Home / Self Care | Payer: Medicaid Other | Attending: Internal Medicine | Admitting: Internal Medicine

## 2018-03-24 DIAGNOSIS — Z5189 Encounter for other specified aftercare: Secondary | ICD-10-CM | POA: Insufficient documentation

## 2018-03-24 MED ORDER — LIDOCAINE VISCOUS HCL 2 % MT SOLN
OROMUCOSAL | Status: AC
  Filled 2018-03-24: qty 15

## 2018-03-24 NOTE — ED Triage Notes (Signed)
PT had an abscess under her breast lanced and packed two days ago. PT is here for packing removal.

## 2018-03-28 ENCOUNTER — Ambulatory Visit (HOSPITAL_COMMUNITY)
Admission: EM | Admit: 2018-03-28 | Discharge: 2018-03-28 | Disposition: A | Discharge status: Home / Self Care | Payer: Medicaid Other | Attending: Family Medicine | Admitting: Family Medicine

## 2018-03-28 ENCOUNTER — Encounter (HOSPITAL_COMMUNITY): Payer: Self-pay | Admitting: Emergency Medicine

## 2018-03-28 DIAGNOSIS — N611 Abscess of the breast and nipple: Secondary | ICD-10-CM

## 2018-03-28 DIAGNOSIS — Z4800 Encounter for change or removal of nonsurgical wound dressing: Secondary | ICD-10-CM

## 2018-03-28 DIAGNOSIS — Z5189 Encounter for other specified aftercare: Secondary | ICD-10-CM

## 2018-03-28 NOTE — Discharge Instructions (Signed)
Finish antibiotic, warm compresses Monitor for continued healing  Follow up if symptoms worsening or not resolving

## 2018-03-28 NOTE — ED Provider Notes (Signed)
[facility]    CSN: [id number] Arrival date & time: 03/28/18  1034     History   Chief Complaint Chief Complaint  Patient presents with  . Wound Check    HPI Judson RochKanathea B Hyde is a 28 y.o. female no significant past medical history presenting today for wound check.  Patient had abscess to left breast I&D on 1/15 approximately 6 days ago.  She returned once on 1/17 for recheck and had packing removed and replaced.  Patient returning today for another recheck and removal.  She has been taking doxycycline without issue.  Initially had some nausea with this but this has not persisted.  She has had improvement in her pain.  HPI  History reviewed. No pertinent past medical history.  Patient Active Problem List   Diagnosis Date Noted  . OVERWEIGHT 12/05/2008  . ANEMIA 12/05/2008    Past Surgical History:  Procedure Laterality Date  . NO PAST SURGERIES      OB History    Gravida  2   Para  2   Term  2   Preterm      AB      Living  2     SAB      TAB      Ectopic      Multiple  0   Live Births  2            Home Medications    Prior to Admission medications   Medication Sig Start Date End Date Taking? Authorizing Provider  doxycycline (VIBRAMYCIN) 100 MG capsule Take 1 capsule (100 mg total) by mouth 2 (two) times daily for 10 days. 03/22/18 04/01/18  Jonta Gastineau C, PA-C  HYDROcodone-acetaminophen (NORCO/VICODIN) 5-325 MG tablet Take 1-2 tablets by mouth every 6 (six) hours as needed for moderate pain or severe pain. 09/28/16   Lavera GuiseLiu, Dana Duo, MD  ondansetron (ZOFRAN ODT) 8 MG disintegrating tablet Take 1 tablet (8 mg total) by mouth every 8 (eight) hours as needed. 09/06/16   Zadie RhineWickline, Donald, MD    Family History Family History  Problem Relation Age of Onset  . Hypertension Maternal Grandmother     Social History Social History   Tobacco Use  . Smoking status: Never Smoker  . Smokeless tobacco: Never Used  Substance Use Topics  .  Alcohol use: No  . Drug use: No     Allergies   Amoxicillin   Review of Systems Review of Systems  Constitutional: Negative for fatigue and fever.  Eyes: Negative for visual disturbance.  Respiratory: Negative for shortness of breath.   Cardiovascular: Negative for chest pain.  Gastrointestinal: Negative for abdominal pain, nausea and vomiting.  Musculoskeletal: Negative for arthralgias and joint swelling.  Skin: Positive for color change and wound. Negative for rash.  Neurological: Negative for dizziness, weakness, light-headedness and headaches.     Physical Exam Triage Vital Signs ED Triage Vitals  Enc Vitals Group     BP 03/28/18 1048 (!) 156/88     Pulse Rate 03/28/18 1048 77     Resp 03/28/18 1048 18     Temp 03/28/18 1048 98.1 F (36.7 C)     Temp Source 03/28/18 1048 Oral     SpO2 03/28/18 1048 100 %     Weight --      Height --      Head Circumference --      Peak Flow --      Pain Score 03/28/18 1047 3  Pain Loc --      Pain Edu? --      Excl. in GC? --    No data found.  Updated Vital Signs BP (!) 156/88 (BP Location: Left Arm)   Pulse 77   Temp 98.1 F (36.7 C) (Oral)   Resp 18   SpO2 100%   Visual Acuity Right Eye Distance:   Left Eye Distance:   Bilateral Distance:    Right Eye Near:   Left Eye Near:    Bilateral Near:     Physical Exam Vitals signs and nursing note reviewed.  Constitutional:      Appearance: She is well-developed.     Comments: No acute distress  HENT:     Head: Normocephalic and atraumatic.     Nose: Nose normal.  Eyes:     Conjunctiva/sclera: Conjunctivae normal.  Neck:     Musculoskeletal: Neck supple.  Cardiovascular:     Rate and Rhythm: Normal rate.  Pulmonary:     Effort: Pulmonary effort is normal. No respiratory distress.  Abdominal:     General: There is no distension.  Musculoskeletal: Normal range of motion.  Skin:    General: Skin is warm and dry.     Comments: Left lower breast with  well-healing abscess, minimal induration left, mild tenderness, other areas of hyperpigmentation and smaller abscesses appear improved compared to previous visit.  Neurological:     Mental Status: She is alert and oriented to person, place, and time.      UC Treatments / Results  Labs (all labs ordered are listed, but only abnormal results are displayed) Labs Reviewed - No data to display  EKG None  Radiology No results found.  Procedures Procedures (including critical care time)  Medications Ordered in UC Medications - No data to display  Initial Impression / Assessment and Plan / UC Course  I have reviewed the triage vital signs and the nursing notes.  Pertinent labs & imaging results that were available during my care of the patient were reviewed by me and considered in my medical decision making (see chart for details).     Packing removed.  We will not place any further packing.  Will have patient finish antibiotic, continue warm compresses and monitor for continued healing.  Follow-up if symptoms persisting or worsening.Discussed strict return precautions. Patient verbalized understanding and is agreeable with plan.  Final Clinical Impressions(s) / UC Diagnoses   Final diagnoses:  Visit for wound check     Discharge Instructions     Finish antibiotic, warm compresses Monitor for continued healing  Follow up if symptoms worsening or not resolving   ED Prescriptions    None     Controlled Substance Prescriptions [place] Controlled Substance Registry consulted? Not Applicable   Lew Dawes, [place] 03/28/18 1111

## 2018-03-28 NOTE — ED Triage Notes (Signed)
Pt here for wound check to left breast

## 2018-04-24 ENCOUNTER — Encounter (HOSPITAL_COMMUNITY): Payer: Self-pay

## 2018-04-24 ENCOUNTER — Ambulatory Visit (HOSPITAL_COMMUNITY)
Admission: EM | Admit: 2018-04-24 | Discharge: 2018-04-24 | Disposition: A | Discharge status: Home / Self Care | Payer: Medicaid Other | Attending: Family Medicine | Admitting: Family Medicine

## 2018-04-24 DIAGNOSIS — R1084 Generalized abdominal pain: Secondary | ICD-10-CM

## 2018-04-24 DIAGNOSIS — K529 Noninfective gastroenteritis and colitis, unspecified: Secondary | ICD-10-CM

## 2018-04-24 MED ORDER — ONDANSETRON 4 MG PO TBDP: 4.0000 mg | ORAL_TABLET | Freq: Three times a day (TID) | ORAL | 0 refills | Status: DC | PRN

## 2018-04-24 NOTE — Discharge Instructions (Addendum)
Please do your best to ensure adequate fluid intake in order to avoid dehydration. If you find that you are unable to tolerate drinking fluids regularly please proceed to the Emergency Department for evaluation.  Also, you should return to the hospital if you experience persistent fevers for greater than 1-2 more days, increasing abdominal pain that persists despite medications, persistent diarrhea, dizziness, syncope (fainting), or for any other concerns you may find worrisome.  

## 2018-04-24 NOTE — ED Triage Notes (Signed)
Pt presents with nausea, vomiting, diarrhea, and generalized abdominal pain X 4 days.

## 2018-04-24 NOTE — ED Provider Notes (Signed)
[facility]   [id number] 04/24/18 Arrival Time: 0820  ASSESSMENT & PLAN:  1. Gastroenteritis   2. Abdominal cramping, generalized    Majority of symptom onset within the past 24 hours. Benign abdominal exam. No indication for urgent abdominal imaging at this time. No signs of dehydration requiring IVF. Discussed.  Meds ordered this encounter  Medications  . ondansetron (ZOFRAN-ODT) 4 MG disintegrating tablet    Sig: Take 1 tablet (4 mg total) by mouth every 8 (eight) hours as needed for nausea or vomiting.    Dispense:  15 tablet    Refill:  0   Discussed typical duration of symptoms for suspected viral GI illness. Will do her best to ensure adequate fluid intake in order to avoid dehydration. Will proceed to the Emergency Department for evaluation if unable to tolerate PO fluids regularly or if she begins to experience significant abdominal pain, not just cramping.  Otherwise she will f/u with her PCP or here if not showing improvement over the next 48-72 hours.  Declines UPT today. Reports menstrual period should be starting within the next few days.  Reviewed expectations re: course of current medical issues. Questions answered. Outlined signs and symptoms indicating need for more acute intervention. Patient verbalized understanding. After Visit Summary given.   SUBJECTIVE: History from: patient.  Susan Stone is a 28 y.o. female who presents with complaint of non-bilious, non-bloody intermittent nausea and vomiting of brown material with non-bloody diarrhea. Very mild and similar symptoms 2-3 days ago. Was feeling better. Current symptom onset yesterday. Abdominal discomfort: mild and cramping. Aggravating factors: eating. Alleviating factors: none. Associated symptoms: fatigue. She denies arthralgias, belching, constipation, fever, headache, myalgias and sweats. Appetite: decreased. PO intake: decreased. Ambulatory without assistance. Urinary symptoms: none. No  vaginal discharge or pelvic pain. Sick contacts: none. Recent travel or camping: none. OTC treatment: none.  Patient's last menstrual period was 03/28/2018.  Past Surgical History:  Procedure Laterality Date  . NO PAST SURGERIES     ROS: As per HPI. All other systems negative.  OBJECTIVE:  Vitals:   04/24/18 0910  BP: 130/78  Pulse: 80  Resp: 18  Temp: 98.3 F (36.8 C)  TempSrc: Oral  SpO2: 100%    General appearance: alert; no distress Oropharynx: moist Lungs: clear to auscultation bilaterally; unlabored Heart: regular rate and rhythm Abdomen: soft; non-distended; no significant abdominal tenderness; reports generalized "cramping" feeling; bowel sounds present; no masses or organomegaly; no guarding or rebound tenderness Back: no CVA tenderness Extremities: no edema; symmetrical with no gross deformities Skin: warm; dry Neurologic: normal gait Psychological: alert and cooperative; normal mood and affect   Allergies  Allergen Reactions  . Amoxicillin Rash    And shaking Has patient had a PCN reaction causing immediate rash, facial/tongue/throat swelling, SOB or lightheadedness with hypotension: yes Has patient had a PCN reaction causing severe rash involving mucus membranes or skin necrosis: no Has patient had a PCN reaction that required hospitalization: no Has patient had a PCN reaction occurring within the last 10 years: no If all of the above answers are "NO", then may proceed with Cephalosporin use.    PMH: Chlamydia; treated.                                             Social History   Socioeconomic History  . Marital status: Single    Spouse  name: Not on file  . Number of children: Not on file  . Years of education: Not on file  . Highest education level: Not on file  Occupational History  . Not on file  Social Needs  . Financial resource strain: Not on file  . Food insecurity:    Worry: Not on file    Inability: Not on file  . Transportation  needs:    Medical: Not on file    Non-medical: Not on file  Tobacco Use  . Smoking status: Never Smoker  . Smokeless tobacco: Never Used  Substance and Sexual Activity  . Alcohol use: No  . Drug use: No  . Sexual activity: Yes  Lifestyle  . Physical activity:    Days per week: Not on file    Minutes per session: Not on file  . Stress: Not on file  Relationships  . Social connections:    Talks on phone: Not on file    Gets together: Not on file    Attends religious service: Not on file    Active member of club or organization: Not on file    Attends meetings of clubs or organizations: Not on file    Relationship status: Not on file  . Intimate partner violence:    Fear of current or ex partner: Not on file    Emotionally abused: Not on file    Physically abused: Not on file    Forced sexual activity: Not on file  Other Topics Concern  . Not on file  Social History Narrative  . Not on file   Family History  Problem Relation Age of Onset  . Hypertension Maternal Grandmother   . Healthy Mother   . Healthy Father      Mardella Layman, MD 04/24/18 1302

## 2018-08-10 ENCOUNTER — Ambulatory Visit (HOSPITAL_COMMUNITY)
Admission: EM | Admit: 2018-08-10 | Discharge: 2018-08-10 | Disposition: A | Discharge status: Home / Self Care | Payer: Medicaid Other | Attending: Urgent Care | Admitting: Urgent Care

## 2018-08-10 ENCOUNTER — Encounter (HOSPITAL_COMMUNITY): Payer: Self-pay | Admitting: Urgent Care

## 2018-08-10 ENCOUNTER — Other Ambulatory Visit: Payer: Self-pay

## 2018-08-10 DIAGNOSIS — J3089 Other allergic rhinitis: Secondary | ICD-10-CM

## 2018-08-10 DIAGNOSIS — H5789 Other specified disorders of eye and adnexa: Secondary | ICD-10-CM

## 2018-08-10 DIAGNOSIS — H1033 Unspecified acute conjunctivitis, bilateral: Secondary | ICD-10-CM | POA: Diagnosis not present

## 2018-08-10 MED ORDER — AZELASTINE HCL 0.05 % OP SOLN: 1.0000 [drp] | Freq: Two times a day (BID) | OPHTHALMIC | 12 refills | Status: DC

## 2018-08-10 NOTE — Discharge Instructions (Addendum)
If your insurance does not cover azelastine eye drops, then an otc allergy eye drop that you can use is called Opcon A.

## 2018-08-10 NOTE — ED Provider Notes (Signed)
MRN: [id number] DOB: 1990/09/17  Subjective:   Susan Stone is a 28 y.o. female presenting for 1 day history of bilateral eye irritation including itching and redness with watering that is clear of both eyes.  Patient reports that symptoms started after she took her contacts out last night.  She has a history of bad allergies and takes Zyrtec every day for this.  Denies fever, eye trauma, photophobia, eye pain, purulent drainage, eye swelling, foreign body sensation.  Denies taking chronic medications apart from Zyrtec as above.   Allergies  Allergen Reactions  . Amoxicillin Rash    And shaking Has patient had a PCN reaction causing immediate rash, facial/tongue/throat swelling, SOB or lightheadedness with hypotension: yes Has patient had a PCN reaction causing severe rash involving mucus membranes or skin necrosis: no Has patient had a PCN reaction that required hospitalization: no Has patient had a PCN reaction occurring within the last 10 years: no If all of the above answers are "NO", then may proceed with Cephalosporin use.     History reviewed. No pertinent past medical history.    Past Surgical History:  Procedure Laterality Date  . NO PAST SURGERIES      ROS  Objective:   Vitals: BP 126/87 (BP Location: Right Arm)   Pulse 79   Temp 98.7 F (37.1 C) (Oral)   Resp 18   LMP 08/09/2018   SpO2 99%   Physical Exam Constitutional:      General: She is not in acute distress.    Appearance: Normal appearance. She is well-developed. She is not ill-appearing.  HENT:     Head: Normocephalic and atraumatic.     Nose: Nose normal.     Mouth/Throat:     Mouth: Mucous membranes are moist.     Pharynx: Oropharynx is clear.  Eyes:     General: Vision grossly intact. No scleral icterus.       Right eye: No foreign body, discharge or hordeolum.        Left eye: No foreign body, discharge or hordeolum.     Extraocular Movements: Extraocular movements intact.   Conjunctiva/sclera:     Right eye: Right conjunctiva is injected. No chemosis, exudate or hemorrhage.    Left eye: Left conjunctiva is injected (Left worse than right over lateral aspect). No chemosis, exudate or hemorrhage.    Pupils: Pupils are equal, round, and reactive to light.  Cardiovascular:     Rate and Rhythm: Normal rate.  Pulmonary:     Effort: Pulmonary effort is normal.  Skin:    General: Skin is warm and dry.  Neurological:     General: No focal deficit present.     Mental Status: She is alert and oriented to person, place, and time.  Psychiatric:        Mood and Affect: Mood normal.        Behavior: Behavior normal.    Assessment and Plan :   Acute conjunctivitis of both eyes, unspecified acute conjunctivitis type  Red eyes  Allergic rhinitis due to other allergic trigger, unspecified seasonality  For now we will cover for allergic conjunctivitis with azelastine eye spray.  I will have patient maintain her Zyrtec.  Patient is to use eyeglasses only.  Recommend she take time from work due to possibility of bacterial conjunctivitis. Counseled patient on potential for adverse effects with medications recommended/prescribed today, patient verbalized understanding. ER and return-to-clinic precautions discussed, patient verbalized understanding.    Wallis Bamberg, [place] 08/10/18 [phone]

## 2018-08-10 NOTE — ED Triage Notes (Signed)
Pt c/o bilateral eye irritation since last night after taking contacts out. States her work needs her checked before returning back.

## 2018-11-18 ENCOUNTER — Ambulatory Visit (INDEPENDENT_AMBULATORY_CARE_PROVIDER_SITE_OTHER): Payer: Medicaid Other

## 2018-11-18 ENCOUNTER — Other Ambulatory Visit: Payer: Self-pay

## 2018-11-18 ENCOUNTER — Ambulatory Visit (HOSPITAL_COMMUNITY)
Admission: EM | Admit: 2018-11-18 | Discharge: 2018-11-18 | Disposition: A | Discharge status: Home / Self Care | Payer: Medicaid Other | Attending: Family Medicine | Admitting: Family Medicine

## 2018-11-18 ENCOUNTER — Encounter (HOSPITAL_COMMUNITY): Payer: Self-pay | Admitting: Emergency Medicine

## 2018-11-18 DIAGNOSIS — Z3202 Encounter for pregnancy test, result negative: Secondary | ICD-10-CM | POA: Diagnosis not present

## 2018-11-18 DIAGNOSIS — K5901 Slow transit constipation: Secondary | ICD-10-CM | POA: Diagnosis not present

## 2018-11-18 LAB — POCT PREGNANCY, URINE: Preg Test, Ur: NEGATIVE

## 2018-11-18 LAB — POCT URINALYSIS DIP (DEVICE)
Bilirubin Urine: NEGATIVE
Glucose, UA: NEGATIVE mg/dL
Ketones, ur: NEGATIVE mg/dL
Leukocytes,Ua: NEGATIVE
Nitrite: NEGATIVE
Protein, ur: NEGATIVE mg/dL
Specific Gravity, Urine: 1.02 (ref 1.005–1.030)
Urobilinogen, UA: 0.2 mg/dL (ref 0.0–1.0)
pH: 6 (ref 5.0–8.0)

## 2018-11-18 MED ORDER — POLYETHYLENE GLYCOL 3350 17 GM/SCOOP PO POWD: ORAL | 0 refills | Status: DC

## 2018-11-18 NOTE — ED Triage Notes (Signed)
Pt reports right sided mid pain that starts under her right rib cage and reaches around to her back.  Pt states it is worse when she tries to lie down.  Pt denies any urinary symptoms or abdominal pain.  She also denies any injury.

## 2018-11-18 NOTE — ED Provider Notes (Signed)
[facility]    CSN: [id number] Arrival date & time: 11/18/18  1018      History   Chief Complaint Chief Complaint  Patient presents with  . Flank Pain    right    HPI Susan Stone is a 28 y.o. female.   Established [facility] patient.  Pt reports right sided mid pain that starts under her right rib cage and reaches around to her back.  Pt states it is worse when she tries to lie down.  Pt denies any urinary symptoms or abdominal pain.  She also denies any injury.  Patient states that the pain came on spontaneously.  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  She is never had this kind of pain before.  She states that the pain is constant and unrelated to food.  She is had no fever, nausea, vomiting, or diarrhea.  Patient has no urinary symptoms.     History reviewed. No pertinent past medical history.  Patient Active Problem List   Diagnosis Date Noted  . OVERWEIGHT 12/05/2008  . ANEMIA 12/05/2008    Past Surgical History:  Procedure Laterality Date  . NO PAST SURGERIES      OB History    Gravida  2   Para  2   Term  2   Preterm      AB      Living  2     SAB      TAB      Ectopic      Multiple  0   Live Births  2            Home Medications    Prior to Admission medications   Medication Sig Start Date End Date Taking? Authorizing Provider  azelastine (OPTIVAR) 0.05 % ophthalmic solution Place 1 drop into both eyes 2 (two) times daily. 08/10/18   Wallis BambergMani, Mario, PA-C  polyethylene glycol powder [facility](MIRALAX) 17 GM/SCOOP powder Take one scoopful in 8 oz twice daily 11/18/18   Elvina SidleLauenstein, Meryl Ponder, MD    Family History Family History  Problem Relation Age of Onset  . Hypertension Maternal Grandmother   . Healthy Mother   . Healthy Father     Social History Social History   Tobacco Use  . Smoking status: Never Smoker  . Smokeless tobacco: Never Used  Substance Use Topics  . Alcohol use: No   . Drug use: No     Allergies   Amoxicillin   Review of Systems Review of Systems  Gastrointestinal: Positive for abdominal pain.  All other systems reviewed and are negative.    Physical Exam Triage Vital Signs ED Triage Vitals  Enc Vitals Group     BP 11/18/18 1040 125/68     Pulse Rate 11/18/18 1040 76     Resp 11/18/18 1040 12     Temp 11/18/18 1040 98.4 F (36.9 C)     Temp Source 11/18/18 1040 Oral     SpO2 11/18/18 1040 100 %     Weight --      Height --      Head Circumference --      Peak Flow --      Pain Score 11/18/18 1041 10     Pain Loc --      Pain Edu? --      Excl. in GC? --    No data found.  Updated Vital Signs BP 125/68 (  BP Location: Right Arm)   Pulse 76   Temp 98.4 F (36.9 C) (Oral)   Resp 12   LMP 11/03/2018 (Approximate) Comment: pregnancy test 11/18/2018 negative  SpO2 100%    Physical Exam Vitals signs and nursing note reviewed.  Constitutional:      General: She is not in acute distress.    Appearance: Normal appearance. She is obese. She is not ill-appearing or toxic-appearing.  HENT:     Head: Normocephalic.  Eyes:     Conjunctiva/sclera: Conjunctivae normal.  Neck:     Musculoskeletal: Normal range of motion and neck supple.  Cardiovascular:     Rate and Rhythm: Normal rate and regular rhythm.     Pulses: Normal pulses.     Heart sounds: Normal heart sounds.  Pulmonary:     Effort: Pulmonary effort is normal.     Breath sounds: Normal breath sounds.  Abdominal:     General: Bowel sounds are normal.     Palpations: Abdomen is soft. There is no mass.     Tenderness: There is no abdominal tenderness. There is no guarding or rebound.  Musculoskeletal: Normal range of motion.  Skin:    General: Skin is warm and dry.  Neurological:     General: No focal deficit present.     Mental Status: She is alert and oriented to person, place, and time.  Psychiatric:        Mood and Affect: Mood normal.        Behavior: Behavior  normal.        Thought Content: Thought content normal.        Judgment: Judgment normal.      UC Treatments / Results  Labs (all labs ordered are listed, but only abnormal results are displayed) Labs Reviewed  POCT URINALYSIS DIP (DEVICE) - Abnormal; Notable for the following components:      Result Value   Hgb urine dipstick TRACE (*)    All other components within normal limits  POC URINE PREG, ED  POCT PREGNANCY, URINE    EKG   Radiology No results found.  Procedures Procedures (including critical care time)  Medications Ordered in UC Medications - No data to display  Initial Impression / Assessment and Plan / UC Course  I have reviewed the triage vital signs and the nursing notes.  Pertinent labs & imaging results that were available during my care of the patient were reviewed by me and considered in my medical decision making (see chart for details).    Final Clinical Impressions(s) / UC Diagnoses   Final diagnoses:  Slow transit constipation   Discharge Instructions   None    ED Prescriptions    Medication Sig Dispense Auth. Provider   polyethylene glycol powder (MIRALAX) 17 GM/SCOOP powder Take one scoopful in 8 oz twice daily 255 g Robyn Haber, MD     Controlled Substance Prescriptions [place] Controlled Substance Registry consulted? Not Applicable   Robyn Haber, MD 11/18/18 1132

## 2019-03-21 IMAGING — US US ABDOMEN LIMITED
1 series · 14 of 25 positions shown · non-contrast
Comparison: CT abdomen pelvis of 09/28/2016

CLINICAL DATA: Right-sided pain for 1 day

EXAM:
ULTRASOUND ABDOMEN LIMITED RIGHT UPPER QUADRANT

[Series 1: us abdomen limited · 0.25mm/px · 14 of 53 slices shown]
[im 1/53]
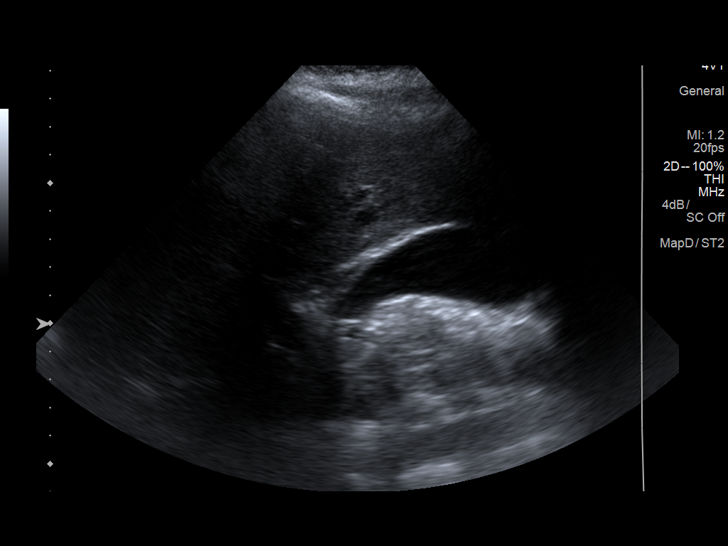
[im 5/53]
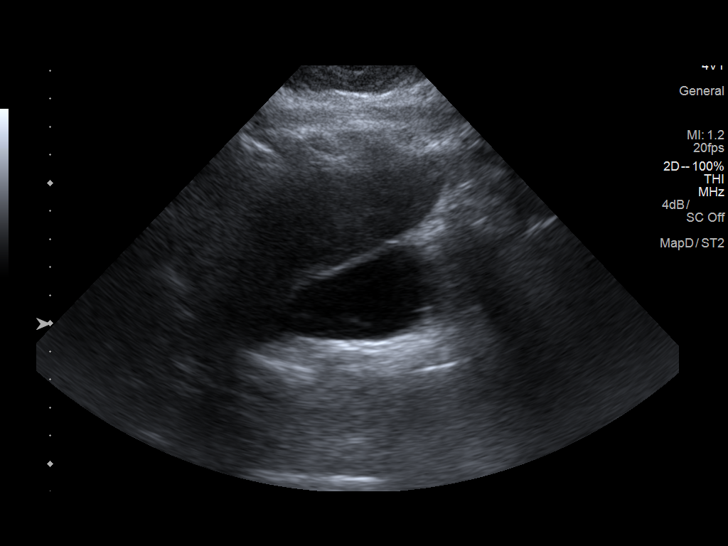
[im 9/53]
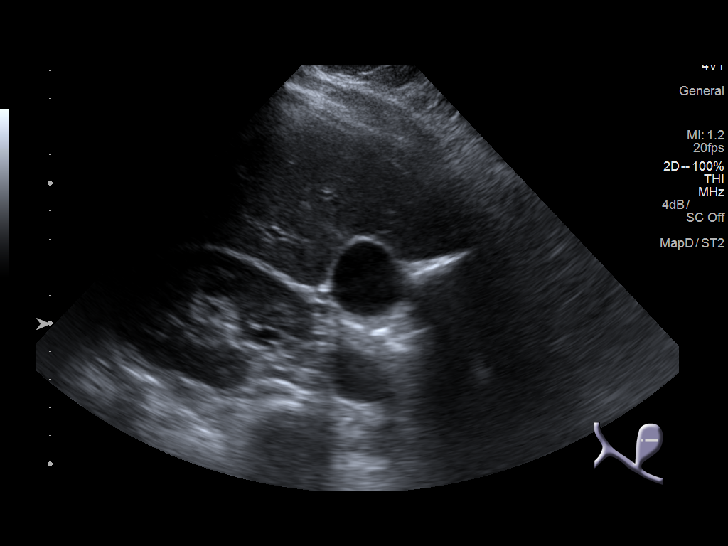
[im 14/53]
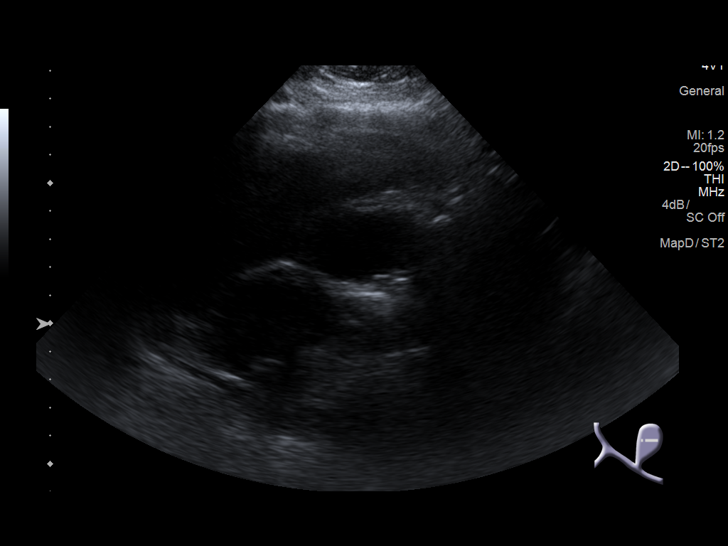
[im 18/53]
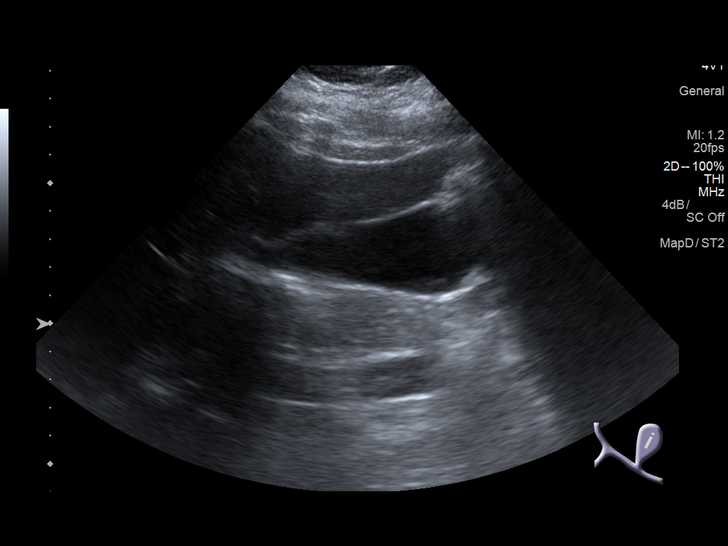
[im 20/53]
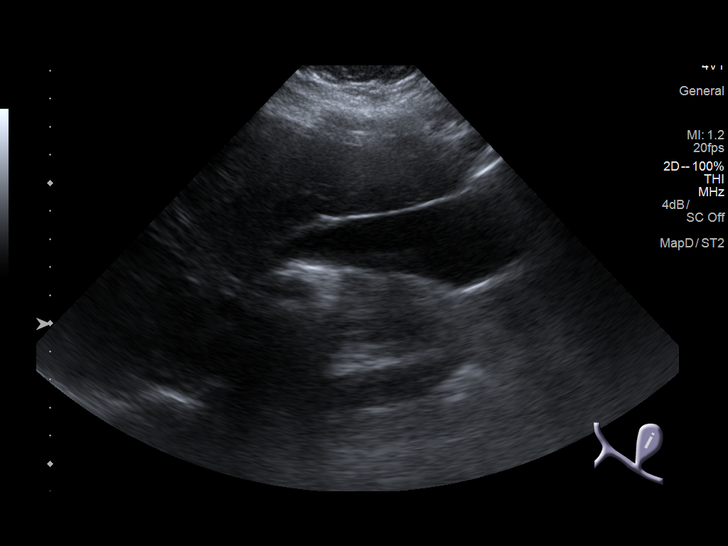
[im 24/53]
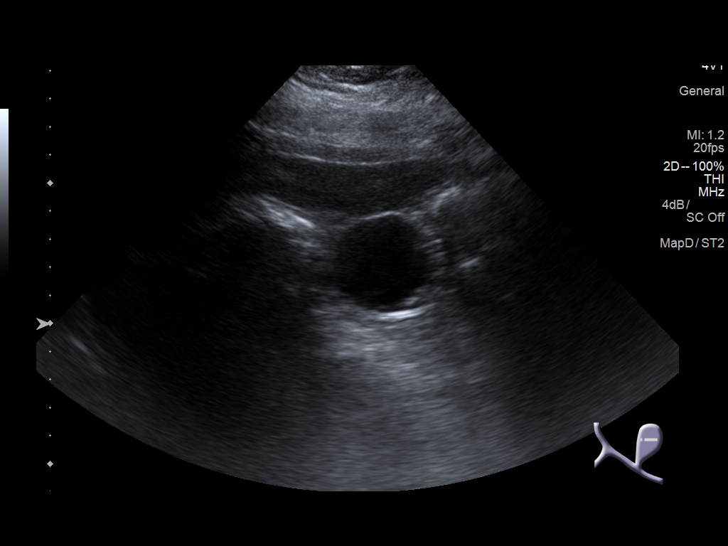
[im 29/53]
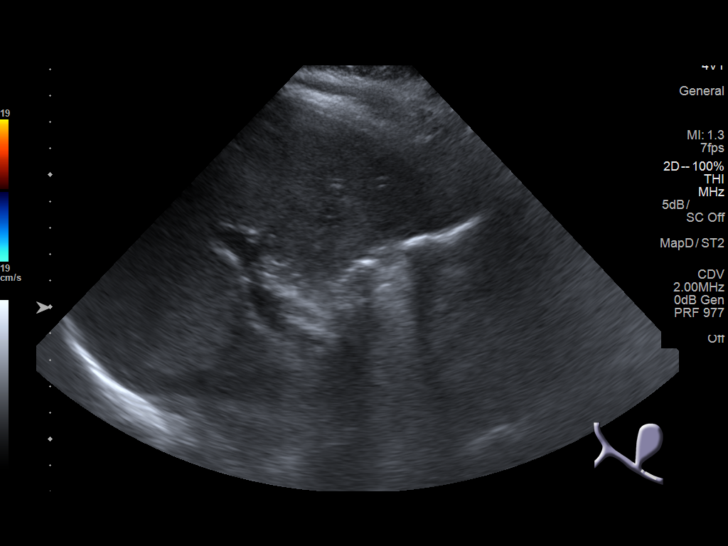
[im 33/53]
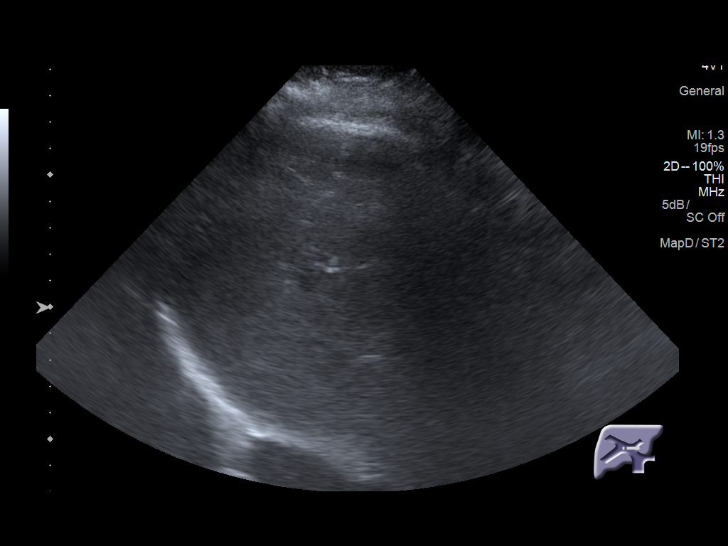
[im 35/53]
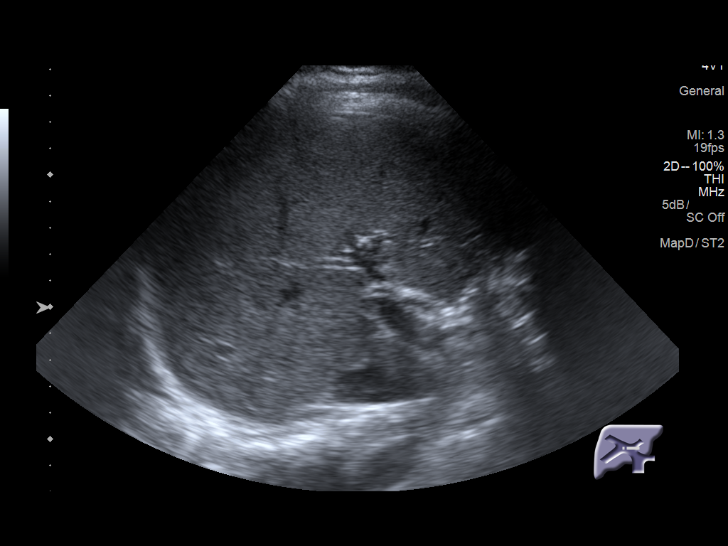
[im 40/53]
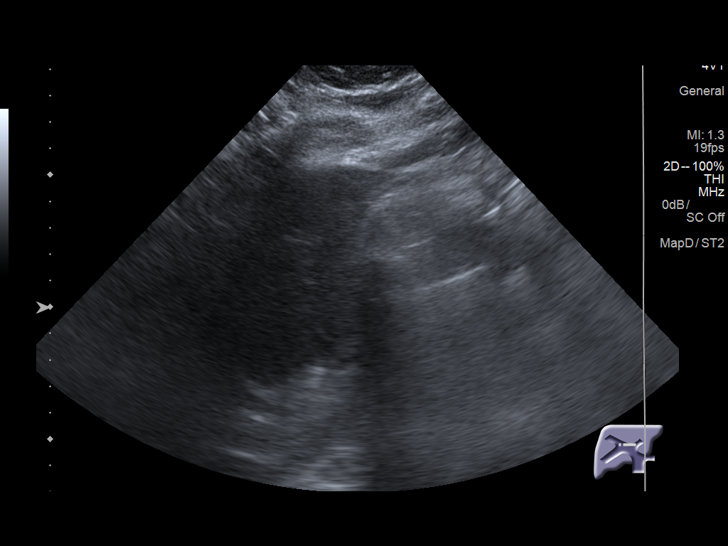
[im 44/53]
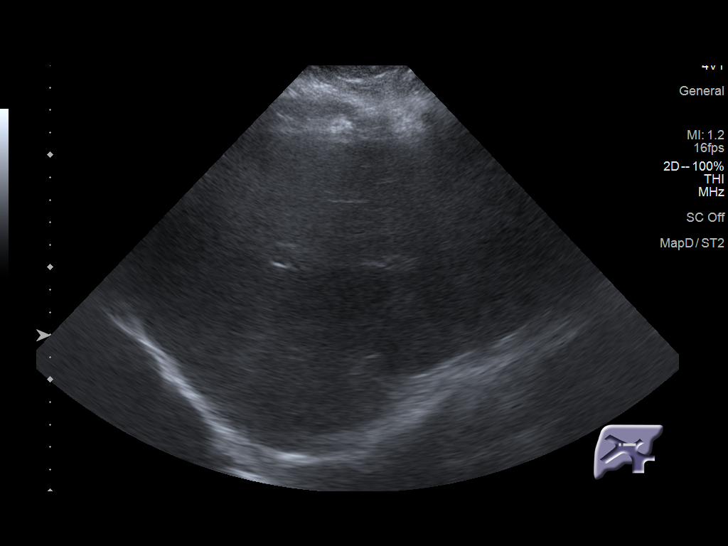
[im 48/53]
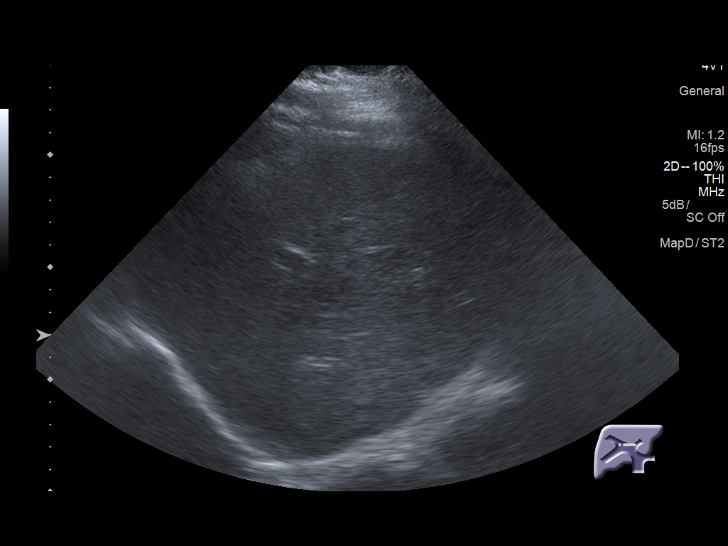
[im 53/53]
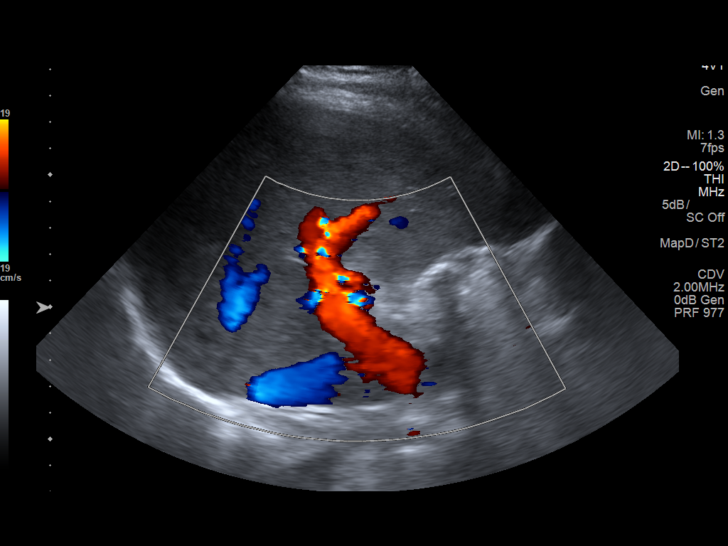

[14 of 25 positions shown; findings below may reference images not displayed]

FINDINGS: Gallbladder:

A single gallstone is noted within the gallbladder. No pain is
present over the gallbladder with compression and the gallbladder
wall is not thickened.

Common bile duct:

Diameter: The common bile duct is normal measuring 5.2 mm.

Liver:

The echogenicity of the liver parenchyma is somewhat increased
suggesting mild fatty infiltration. No focal hepatic abnormality is
noted. Normal portal venous blood flow is demonstrated.
IMPRESSION: 1. Single gallstone. No present ultrasound evidence of acute
cholecystitis.
2. Suspect mild fatty infiltration of the liver.

## 2020-02-04 ENCOUNTER — Other Ambulatory Visit: Payer: Self-pay

## 2020-02-04 ENCOUNTER — Ambulatory Visit (HOSPITAL_COMMUNITY)
Admission: EM | Admit: 2020-02-04 | Discharge: 2020-02-04 | Disposition: A | Discharge status: Home / Self Care | Payer: 59 | Attending: Family Medicine | Admitting: Family Medicine

## 2020-02-04 ENCOUNTER — Encounter (HOSPITAL_COMMUNITY): Payer: Self-pay

## 2020-02-04 DIAGNOSIS — N760 Acute vaginitis: Secondary | ICD-10-CM | POA: Insufficient documentation

## 2020-02-04 DIAGNOSIS — R109 Unspecified abdominal pain: Secondary | ICD-10-CM | POA: Diagnosis not present

## 2020-02-04 DIAGNOSIS — Z3202 Encounter for pregnancy test, result negative: Secondary | ICD-10-CM | POA: Diagnosis not present

## 2020-02-04 DIAGNOSIS — N939 Abnormal uterine and vaginal bleeding, unspecified: Secondary | ICD-10-CM

## 2020-02-04 LAB — POCT URINALYSIS DIPSTICK, ED / UC
Bilirubin Urine: NEGATIVE
Glucose, UA: NEGATIVE mg/dL
Ketones, ur: NEGATIVE mg/dL
Nitrite: NEGATIVE
Protein, ur: NEGATIVE mg/dL
Specific Gravity, Urine: 1.01 (ref 1.005–1.030)
Urobilinogen, UA: 0.2 mg/dL (ref 0.0–1.0)
pH: 6 (ref 5.0–8.0)

## 2020-02-04 LAB — CERVICOVAGINAL ANCILLARY ONLY
Bacterial Vaginitis (gardnerella): POSITIVE — AB
Candida Glabrata: NEGATIVE
Candida Vaginitis: NEGATIVE
Chlamydia: NEGATIVE
Comment: NEGATIVE
Comment: NEGATIVE
Comment: NEGATIVE
Comment: NEGATIVE
Comment: NEGATIVE
Comment: NORMAL
Neisseria Gonorrhea: NEGATIVE
Trichomonas: NEGATIVE

## 2020-02-04 LAB — POC URINE PREG, ED: Preg Test, Ur: NEGATIVE

## 2020-02-04 NOTE — ED Triage Notes (Signed)
Pt in with c/o vaginal spotting and lower abdominal pain that has been gong on for a few days. States that her menstrual cycle went off last week but she has still been spotting.  States that she only has pain after she urinates and is going more frequently

## 2020-02-04 NOTE — ED Provider Notes (Signed)
[facility]    CSN: [id number] Arrival date & time: 02/04/20  0802      History   Chief Complaint Chief Complaint  Patient presents with   Vaginal Bleeding   Abdominal Pain    HPI Susan Stone is a 29 y.o. female.   Presenting today with lower abdominal cramping, spotting the past few days. Just came off her period last week but continues to spot. Also having some urinary frequency and mild dysuria post-stream. Denies fever, chills, flank pain, N/V/D, concern for STIs.      History reviewed. No pertinent past medical history.  Patient Active Problem List   Diagnosis Date Noted   OVERWEIGHT 12/05/2008   ANEMIA 12/05/2008    Past Surgical History:  Procedure Laterality Date   NO PAST SURGERIES      OB History    Gravida  2   Para  2   Term  2   Preterm      AB      Living  2     SAB      TAB      Ectopic      Multiple  0   Live Births  2            Home Medications    Prior to Admission medications   Medication Sig Start Date End Date Taking? Authorizing Provider  azelastine (OPTIVAR) 0.05 % ophthalmic solution Place 1 drop into both eyes 2 (two) times daily. 08/10/18   Wallis Bamberg, PA-C  polyethylene glycol powder [facility]) 17 GM/SCOOP powder Take one scoopful in 8 oz twice daily 11/18/18   Elvina Sidle, MD    Family History Family History  Problem Relation Age of Onset   Hypertension Maternal Grandmother    Healthy Mother    Healthy Father     Social History Social History   Tobacco Use   Smoking status: Never Smoker   Smokeless tobacco: Never Used  Substance Use Topics   Alcohol use: No   Drug use: No     Allergies   Amoxicillin   Review of Systems Review of Systems PER HPI    Physical Exam Triage Vital Signs ED Triage Vitals  Enc Vitals Group     BP 02/04/20 0819 120/76     Pulse Rate 02/04/20 0819 88     Resp 02/04/20 0819 18     Temp 02/04/20 0819 98 F (36.7 C)     Temp  Source 02/04/20 0819 Oral     SpO2 02/04/20 0819 100 %     Weight --      Height --      Head Circumference --      Peak Flow --      Pain Score 02/04/20 0817 0     Pain Loc --      Pain Edu? --      Excl. in GC? --    No data found.  Updated Vital Signs BP 120/76 (BP Location: Right Arm)    Pulse 88    Temp 98 F (36.7 C) (Oral)    Resp 18    LMP 01/25/2020 (Approximate)    SpO2 100%   Visual Acuity Right Eye Distance:   Left Eye Distance:   Bilateral Distance:    Right Eye Near:   Left Eye Near:    Bilateral Near:     Physical Exam Vitals and nursing note reviewed.  Constitutional:      Appearance: Normal  appearance. She is not ill-appearing.  HENT:     Head: Atraumatic.     Mouth/Throat:     Mouth: Mucous membranes are moist.     Pharynx: Oropharynx is clear.  Eyes:     Extraocular Movements: Extraocular movements intact.     Conjunctiva/sclera: Conjunctivae normal.  Cardiovascular:     Rate and Rhythm: Normal rate and regular rhythm.     Heart sounds: Normal heart sounds.  Pulmonary:     Effort: Pulmonary effort is normal.     Breath sounds: Normal breath sounds.  Abdominal:     General: Bowel sounds are normal. There is no distension.     Palpations: Abdomen is soft.     Tenderness: There is no abdominal tenderness. There is no right CVA tenderness, left CVA tenderness or guarding.  Musculoskeletal:        General: Normal range of motion.     Cervical back: Normal range of motion and neck supple.  Skin:    General: Skin is warm and dry.  Neurological:     Mental Status: She is alert and oriented to person, place, and time.  Psychiatric:        Mood and Affect: Mood normal.        Thought Content: Thought content normal.        Judgment: Judgment normal.      UC Treatments / Results  Labs (all labs ordered are listed, but only abnormal results are displayed) Labs Reviewed  POCT URINALYSIS DIPSTICK, ED / UC - Abnormal; Notable for the following  components:      Result Value   Hgb urine dipstick LARGE (*)    Leukocytes,Ua SMALL (*)    All other components within normal limits  URINE CULTURE  POC URINE PREG, ED  CERVICOVAGINAL ANCILLARY ONLY    EKG   Radiology No results found.  Procedures Procedures (including critical care time)  Medications Ordered in UC Medications - No data to display  Initial Impression / Assessment and Plan / UC Course  I have reviewed the triage vital signs and the nursing notes.  Pertinent labs & imaging results that were available during my care of the patient were reviewed by me and considered in my medical decision making (see chart for details).     U/A with small leuks and RBCs, will send for urine culture and await aptima swab results. Treat based on these. Discussed good OTC supplements and vaginal hygiene in meantime.   Final Clinical Impressions(s) / UC Diagnoses   Final diagnoses:  Acute vaginitis   Discharge Instructions   None    ED Prescriptions    None     PDMP not reviewed this encounter.   Particia Nearing, [place] 02/04/20 1423

## 2020-02-05 LAB — URINE CULTURE: Culture: >=100000 — AB

## 2020-02-06 ENCOUNTER — Telehealth (HOSPITAL_COMMUNITY): Payer: Self-pay | Admitting: Emergency Medicine

## 2020-02-06 LAB — URINE CULTURE

## 2020-02-06 MED ORDER — NITROFURANTOIN MONOHYD MACRO 100 MG PO CAPS
100.0000 mg | ORAL_CAPSULE | Freq: Two times a day (BID) | ORAL | 0 refills | Status: DC
Start: 2020-02-06 — End: 2021-07-24

## 2020-02-06 MED ORDER — METRONIDAZOLE 500 MG PO TABS
500.0000 mg | ORAL_TABLET | Freq: Two times a day (BID) | ORAL | 0 refills | Status: DC
Start: 2020-02-06 — End: 2020-08-28

## 2020-07-02 ENCOUNTER — Other Ambulatory Visit: Payer: Self-pay

## 2020-07-02 ENCOUNTER — Ambulatory Visit (INDEPENDENT_AMBULATORY_CARE_PROVIDER_SITE_OTHER): Payer: 59 | Admitting: Family Medicine

## 2020-07-02 ENCOUNTER — Encounter: Payer: Self-pay | Admitting: Family Medicine

## 2020-07-02 VITALS — BP 104/62 | HR 97 | Ht 64.0 in | Wt 176.0 lb

## 2020-07-02 DIAGNOSIS — Z0000 Encounter for general adult medical examination without abnormal findings: Secondary | ICD-10-CM

## 2020-07-02 DIAGNOSIS — Z111 Encounter for screening for respiratory tuberculosis: Secondary | ICD-10-CM

## 2020-07-02 NOTE — Patient Instructions (Addendum)
It was great meeting you today! Today you came in to establish care at the clinic. I am glad to see you are doing well!   Please stop by the front desk on the way out. I'd like to see you back in the next several weeks for your pap smear, but if you need to be seen earlier than that for any new issues we're happy to fit you in, just give [place] a call!  Feel free to call with any questions or concerns at [phone].   Take care,  Dr. Cora Collum [facility]

## 2020-07-02 NOTE — Progress Notes (Signed)
    SUBJECTIVE:   CHIEF COMPLAINT / HPI:   Ms. Eads is a 30yo who presents to establish care. Was a patient at the clinic several years ago but had not seen a provider since, only visiting urgent care.   PMH: None   FVO:HKGO   Fam Hx: Mom and grandmother- hypertension. Diabetes    Meds: Zyrtec   Allergies: Amoxicillin   Gyn: LMP 4/22 Menses regular to light and lasts 5 days every month  Believes last pap was 3-4 years ago. 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 Less pork and red meat   OBJECTIVE:   BP 104/62   Pulse 97   Ht 5\' 4"  (1.626 m)   Wt 176 lb (79.8 kg)   SpO2 99%   BMI 30.21 kg/m    General: alert, pleasant, well appearing CV: RRR no murmurs Resp: CTAB normal WOB GI: soft, non distended  Derm: no visible rashes or lesions  MSK: normal tone and ROM   ASSESSMENT/PLAN:   No problem-specific Assessment & Plan notes found for this encounter.  Health maintenance  Requests PPD for her job. Due for pap smear. Declines COVID vaccine - PPD - f/u 2-4 weeks for pap smear   , DO [facility]

## 2020-07-04 ENCOUNTER — Ambulatory Visit (INDEPENDENT_AMBULATORY_CARE_PROVIDER_SITE_OTHER): Payer: 59

## 2020-07-04 ENCOUNTER — Other Ambulatory Visit: Payer: Self-pay

## 2020-07-04 DIAGNOSIS — Z111 Encounter for screening for respiratory tuberculosis: Secondary | ICD-10-CM

## 2020-07-04 LAB — TB SKIN TEST
Induration: 0 mm
TB Skin Test: NEGATIVE

## 2020-07-04 NOTE — Progress Notes (Signed)
Patient is here for a PPD read.  It was placed on 07/02/2020 in the left forearm @ 1545     PPD RESULTS:  Result: negative Induration: 0 mm  Letter created and given to patient for documentation purposes. Veronda Prude, RN

## 2020-07-28 ENCOUNTER — Encounter (HOSPITAL_COMMUNITY): Payer: Self-pay

## 2020-07-28 ENCOUNTER — Ambulatory Visit (HOSPITAL_COMMUNITY)
Admission: EM | Admit: 2020-07-28 | Discharge: 2020-07-28 | Disposition: A | Discharge status: Home / Self Care | Payer: 59 | Attending: Physician Assistant | Admitting: Physician Assistant

## 2020-07-28 DIAGNOSIS — R519 Headache, unspecified: Secondary | ICD-10-CM

## 2020-07-28 MED ORDER — KETOROLAC TROMETHAMINE 30 MG/ML IJ SOLN
15.0000 mg | Freq: Once | INTRAMUSCULAR | Status: AC
  Administered 2020-07-28: 15 mg via INTRAMUSCULAR

## 2020-07-28 MED ORDER — PREDNISONE 50 MG PO TABS: 50.0000 mg | ORAL_TABLET | Freq: Every day | ORAL | 0 refills | Status: DC

## 2020-07-28 MED ORDER — KETOROLAC TROMETHAMINE 30 MG/ML IJ SOLN
INTRAMUSCULAR | Status: AC
  Filled 2020-07-28: qty 1

## 2020-07-28 NOTE — Discharge Instructions (Signed)
No alarming signs on exam, your neurology exam was normal. Toradol injection in office today  If still having pain tomorrow, start prednisone as directed You can use over the counter flonase nasal spray to help with nasal congestion/allergies Keep hydrated, urine should be clear to pale yellow in color.   If having worsening symptoms, vomiting, confusion, vision changes, passing out, go to the emergency department for further evaluation.

## 2020-07-28 NOTE — ED Provider Notes (Signed)
[facility]    CSN: [id number] Arrival date & time: 07/28/20  1616      History   Chief Complaint Chief Complaint  Patient presents with  . Migraine    HPI Susan Stone is a 30 y.o. female.   30 year old female comes in for 3 day history of intermittent headaches. Bilateral frontal headaches, throbbing/pressure pain. Photophobia, phonophobia. Denies nausea, vomiting. Denies vision changes. Denies recent head injuries. Has had rhinorrhea/nasal congestion for the past week. Denies ear pain, dental pain. Denies syncope, dizziness, lightheadedness. Aleve 440mg  with minimal relief.      History reviewed. No pertinent past medical history.  Patient Active Problem List   Diagnosis Date Noted  . OVERWEIGHT 12/05/2008  . ANEMIA 12/05/2008    Past Surgical History:  Procedure Laterality Date  . NO PAST SURGERIES      OB History    Gravida  2   Para  2   Term  2   Preterm      AB      Living  2     SAB      IAB      Ectopic      Multiple  0   Live Births  2            Home Medications    Prior to Admission medications   Medication Sig Start Date End Date Taking? Authorizing Provider  predniSONE (DELTASONE) 50 MG tablet Take 1 tablet (50 mg total) by mouth daily with breakfast. 07/28/20  Yes Elmarie Devlin V, PA-C  azelastine (OPTIVAR) 0.05 % ophthalmic solution Place 1 drop into both eyes 2 (two) times daily. 08/10/18   10/10/18, PA-C  metroNIDAZOLE (FLAGYL) 500 MG tablet Take 1 tablet (500 mg total) by mouth 2 (two) times daily. 02/06/20   Lamptey, 14/1/21, MD  nitrofurantoin, macrocrystal-monohydrate, (MACROBID) 100 MG capsule Take 1 capsule (100 mg total) by mouth 2 (two) times daily. 02/06/20   14/1/21, MD  polyethylene glycol powder [facility]) 17 GM/SCOOP powder Take one scoopful in 8 oz twice daily 11/18/18   01/18/19, MD    Family History Family History  Problem Relation Age of Onset  . Hypertension Maternal  Grandmother   . Healthy Mother   . Healthy Father     Social History Social History   Tobacco Use  . Smoking status: Never Smoker  . Smokeless tobacco: Never Used  Substance Use Topics  . Alcohol use: No  . Drug use: No     Allergies   Amoxicillin   Review of Systems Review of Systems  Reason unable to perform ROS: See HPI as above.     Physical Exam Triage Vital Signs ED Triage Vitals  Enc Vitals Group     BP 07/28/20 1722 107/62     Pulse Rate 07/28/20 1722 81     Resp 07/28/20 1722 20     Temp 07/28/20 1722 98.8 F (37.1 C)     Temp Source 07/28/20 1722 Oral     SpO2 07/28/20 1722 100 %     Weight --      Height --      Head Circumference --      Peak Flow --      Pain Score 07/28/20 1720 8     Pain Loc --      Pain Edu? --      Excl. in GC? --    No data found.  Updated Vital Signs BP 107/62 (BP Location: Right Arm)   Pulse 81   Temp 98.8 F (37.1 C) (Oral)   Resp 20   LMP 07/21/2020 (Exact Date)   SpO2 100%   Physical Exam Constitutional:      General: She is not in acute distress.    Appearance: Normal appearance. She is well-developed. She is not ill-appearing, toxic-appearing or diaphoretic.  HENT:     Head: Normocephalic and atraumatic.     Right Ear: Tympanic membrane, ear canal and external ear normal. Tympanic membrane is not erythematous or bulging.     Left Ear: Tympanic membrane, ear canal and external ear normal. Tympanic membrane is not erythematous or bulging.     Nose:     Right Sinus: No maxillary sinus tenderness or frontal sinus tenderness.     Left Sinus: No maxillary sinus tenderness or frontal sinus tenderness.     Mouth/Throat:     Mouth: Mucous membranes are moist.     Pharynx: Oropharynx is clear. Uvula midline.  Eyes:     Conjunctiva/sclera: Conjunctivae normal.     Pupils: Pupils are equal, round, and reactive to light.  Cardiovascular:     Rate and Rhythm: Normal rate and regular rhythm.  Pulmonary:      Effort: Pulmonary effort is normal. No accessory muscle usage, prolonged expiration, respiratory distress or retractions.     Breath sounds: No decreased air movement or transmitted upper airway sounds. No decreased breath sounds.     Comments: LCTAB Musculoskeletal:     Cervical back: Normal range of motion and neck supple.  Skin:    General: Skin is warm and dry.  Neurological:     Mental Status: She is alert and oriented to person, place, and time.     Comments: Cranial nerves II-XII grossly intact. Strength 5/5 bilaterally for upper and lower extremity. Sensation intact. Normal coordination with normal finger to nose, heel to shin. Negative pronator drift, romberg. Gait intact. Able to ambulate on own without difficulty.       UC Treatments / Results  Labs (all labs ordered are listed, but only abnormal results are displayed) Labs Reviewed - No data to display  EKG   Radiology No results found.  Procedures Procedures (including critical care time)  Medications Ordered in UC Medications  ketorolac (TORADOL) 30 MG/ML injection 15 mg (has no administration in time range)    Initial Impression / Assessment and Plan / UC Course  I have reviewed the triage vital signs and the nursing notes.  Pertinent labs & imaging results that were available during my care of the patient were reviewed by me and considered in my medical decision making (see chart for details).    Alarming signs on exam.  Neurology exam grossly intact without focal deficits.  No obvious sinus tenderness on palpation, however given location, discussed possible sinus pressure.  Toradol injection in office today.  If symptoms not improving, to start prednisone as directed.  Other symptomatic treatment discussed.  Return precautions given.  Patient expresses understanding and agrees to plan.  Final Clinical Impressions(s) / UC Diagnoses   Final diagnoses:  Frontal headache    ED Prescriptions    Medication  Sig Dispense Auth. Provider   predniSONE (DELTASONE) 50 MG tablet Take 1 tablet (50 mg total) by mouth daily with breakfast. 5 tablet Belinda Fisher, PA-C     PDMP not reviewed this encounter.   Belinda Fisher, PA-C 07/28/20 [phone]

## 2020-07-28 NOTE — ED Triage Notes (Signed)
Pt/o migraine x 3 days. She states she has been taking aleve and has had little relief.

## 2020-08-07 NOTE — Progress Notes (Deleted)
    SUBJECTIVE:   CHIEF COMPLAINT / HPI:   Susan Stone is a 30 yo who presents for a pap smear   Gyn: LMP 07/21/20 ***Menses regular to light and lasts 5 days every month  Believes last pap was 3-4 years ago. Denies irregular pap smears   Not sexually actve Not in a relationship  Good support system     No concerns for depression or anxiety    PERTINENT  PMH / PSH: ***  OBJECTIVE:   LMP 07/21/2020 (Exact Date)   ***  ASSESSMENT/PLAN:   No problem-specific Assessment & Plan notes found for this encounter.   Well woman exam   Health maintenance Hep C screen, Pap, COVID vaccine   Cora Collum, DO [place] [facility]   {    This will disappear when note is signed, click to select method of visit    :1}

## 2020-08-08 ENCOUNTER — Encounter: Payer: 59 | Admitting: Family Medicine

## 2020-08-11 ENCOUNTER — Encounter: Payer: Self-pay | Admitting: *Deleted

## 2020-08-25 ENCOUNTER — Encounter: Payer: Self-pay | Admitting: Family Medicine

## 2020-08-25 ENCOUNTER — Other Ambulatory Visit (HOSPITAL_COMMUNITY)
Admission: RE | Admit: 2020-08-25 | Discharge: 2020-08-25 | Disposition: A | Discharge status: Home / Self Care | Payer: 59 | Source: Ambulatory Visit | Attending: Family Medicine | Admitting: Family Medicine

## 2020-08-25 ENCOUNTER — Ambulatory Visit (INDEPENDENT_AMBULATORY_CARE_PROVIDER_SITE_OTHER): Payer: 59 | Admitting: Family Medicine

## 2020-08-25 ENCOUNTER — Other Ambulatory Visit: Payer: Self-pay

## 2020-08-25 VITALS — BP 104/60 | HR 79 | Ht 64.0 in | Wt 179.0 lb

## 2020-08-25 DIAGNOSIS — Z124 Encounter for screening for malignant neoplasm of cervix: Secondary | ICD-10-CM

## 2020-08-25 NOTE — Progress Notes (Signed)
      SUBJECTIVE:   CHIEF COMPLAINT / HPI:   Susan Stone is a 30 y.o. female presenting for her pap smear. Was seen on 4/27 for her new patient visit. Denies vaginal burning, discharge or odor. Currently not sexually active. LMP 08/16/20. No particular questions or concerns today.   No alcohol, tobacoo use, or recreational drug use  Endorses getting 150 min physical activity in a week Eating more protein shakes and smoothies. Eating balanced meal   OBJECTIVE:   BP 104/60   Pulse 79   Ht 5\' 4"  (1.626 m)   Wt 81.2 kg   LMP 08/16/2020   SpO2 99%   BMI 30.73 kg/m    General: NAD, pleasant, able to participate in exam Respiratory: Normal effort, no obvious respiratory distress Pelvic: VULVA: normal appearing vulva with no masses, tenderness or lesions, VAGINA: Normal appearing vagina with normal color, no lesions, with scant discharge present, CERVIX: No lesions, scant discharge present,  Chaperone Jessica CMA present for pelvic exam  ASSESSMENT/PLAN:   No problem-specific Assessment & Plan notes found for this encounter.   Well woman exam  Pap smear completed. Denies previous abnormal pap smears. Not sexually active. Declines STI testing  - f/u pap results   Follow up in 1 year for next annual exam   10/16/2020, DO [facility]

## 2020-08-25 NOTE — Patient Instructions (Signed)
It was great seeing you today! Glad you are doing well!   Today we did your pap smear. I will call you if there are any abnormal results.   I will see you in a year for your next annual physical, but if you need to be seen earlier than that for any new issues we're happy to fit you in, just give [place] a call!  Feel free to call with any questions or concerns at [phone].   Take care,  Dr. Cora Collum [facility]

## 2020-08-26 LAB — CYTOLOGY - PAP
Comment: NEGATIVE
Diagnosis: NEGATIVE
High risk HPV: NEGATIVE

## 2020-09-04 ENCOUNTER — Telehealth: Payer: Self-pay | Admitting: Family Medicine

## 2020-09-04 NOTE — Telephone Encounter (Signed)
Patient is calling to check on the status of her medical form being completed for her employer. Patient said she sent it to Dr. Idalia Needle via [employer]. I did not see in her chart where there was anything sent. Patient would like to know if Dr. Idalia Needle received it. If so she would like for it to be completed and scanned back to her to give to her employer.   If Dr. Idalia Needle has not received form please call patient and let her know. The best call back is [phone].

## 2020-09-05 NOTE — Telephone Encounter (Signed)
I called patient and left voicemail letting her know that we did not receive it though mychart. I asked if she could please fax it or drop it off at the front desk.

## 2020-09-09 ENCOUNTER — Encounter: Payer: Self-pay | Admitting: Family Medicine

## 2020-09-10 NOTE — Telephone Encounter (Signed)
Form in PCP box.

## 2020-09-10 NOTE — Telephone Encounter (Signed)
Form printed and placed in PCP box for review.

## 2020-09-17 ENCOUNTER — Ambulatory Visit: Payer: 59

## 2020-11-08 ENCOUNTER — Ambulatory Visit (HOSPITAL_COMMUNITY)
Admission: EM | Admit: 2020-11-08 | Discharge: 2020-11-08 | Disposition: A | Discharge status: Home / Self Care | Payer: Medicaid Other | Attending: Emergency Medicine | Admitting: Emergency Medicine

## 2020-11-08 ENCOUNTER — Encounter (HOSPITAL_COMMUNITY): Payer: Self-pay | Admitting: *Deleted

## 2020-11-08 DIAGNOSIS — K047 Periapical abscess without sinus: Secondary | ICD-10-CM | POA: Diagnosis not present

## 2020-11-08 DIAGNOSIS — K0889 Other specified disorders of teeth and supporting structures: Secondary | ICD-10-CM

## 2020-11-08 MED ORDER — LIDOCAINE VISCOUS HCL 2 % MT SOLN: 15.0000 mL | OROMUCOSAL | 0 refills | Status: DC | PRN

## 2020-11-08 MED ORDER — CLINDAMYCIN HCL 300 MG PO CAPS: 300.0000 mg | ORAL_CAPSULE | Freq: Three times a day (TID) | ORAL | 0 refills | Status: AC

## 2020-11-08 NOTE — ED Triage Notes (Signed)
Pt reports ear pain started Thursday and dental pain wisdom tooth may be causing Pain per Pt.

## 2020-11-08 NOTE — Discharge Instructions (Addendum)
Take the clindamycin three times a day for the next 7 days.  You can use Ibuprofen and/or Tylenol as needed for pain relief and fever reduction.  You can also use the viscous lidocaine for pain relief (soak a cotton ball in the lidocaine and then place it on your tooth).  Stick with a soft diet until evaluated by dentist and maintain oral hygiene care Follow up with dentist as soon as possible for further evaluation and treatment   Return or go to the ED if you have any new or worsening symptoms such as fever, chills, difficulty swallowing, painful swallowing, oral or neck swelling, nausea, vomiting, chest pain, SOB.

## 2020-11-08 NOTE — ED Provider Notes (Signed)
[facility]    CSN: [id number] Arrival date & time: 11/08/20  1022      History   Chief Complaint Chief Complaint  Patient presents with   Otalgia   Dental Pain    HPI Susan Stone is a 30 y.o. female.   Patient here for evaluation of left sided dental pain and left ear pain that has been ongoing since Thursday.  Reports pain has gotten progressively worse over the past several days.  Has not taken any OTC medication or treatment.  Denies any trauma, injury, or other precipitating event.  Denies any specific alleviating or aggravating factors.  Denies any fevers, chest pain, shortness of breath, N/V/D, numbness, tingling, weakness, abdominal pain, or headaches.    The history is provided by the patient.  Otalgia Dental Pain  History reviewed. No pertinent past medical history.  Patient Active Problem List   Diagnosis Date Noted   OVERWEIGHT 12/05/2008   ANEMIA 12/05/2008    Past Surgical History:  Procedure Laterality Date   NO PAST SURGERIES      OB History     Gravida  2   Para  2   Term  2   Preterm      AB      Living  2      SAB      IAB      Ectopic      Multiple  0   Live Births  2            Home Medications    Prior to Admission medications   Medication Sig Start Date End Date Taking? Authorizing Provider  clindamycin (CLEOCIN) 300 MG capsule Take 1 capsule (300 mg total) by mouth 3 (three) times daily for 7 days. 11/08/20 11/15/20 Yes Ivette Loyal, NP  lidocaine (XYLOCAINE) 2 % solution Use as directed 15 mLs in the mouth or throat as needed for mouth pain. 11/08/20  Yes Ivette Loyal, NP  azelastine (OPTIVAR) 0.05 % ophthalmic solution Place 1 drop into both eyes 2 (two) times daily. 08/10/18   Wallis Bamberg, PA-C  nitrofurantoin, macrocrystal-monohydrate, (MACROBID) 100 MG capsule Take 1 capsule (100 mg total) by mouth 2 (two) times daily. 02/06/20   Merrilee Jansky, MD  polyethylene glycol powder [facility]) 17  GM/SCOOP powder Take one scoopful in 8 oz twice daily 11/18/18   Elvina Sidle, MD  predniSONE (DELTASONE) 50 MG tablet Take 1 tablet (50 mg total) by mouth daily with breakfast. 07/28/20   Belinda Fisher, PA-C    Family History Family History  Problem Relation Age of Onset   Hypertension Maternal Grandmother    Healthy Mother    Healthy Father     Social History Social History   Tobacco Use   Smoking status: Never   Smokeless tobacco: Never  Substance Use Topics   Alcohol use: No   Drug use: No     Allergies   Amoxicillin   Review of Systems Review of Systems  HENT:  Positive for dental problem and ear pain.   All other systems reviewed and are negative.   Physical Exam Triage Vital Signs ED Triage Vitals  Enc Vitals Group     BP 11/08/20 1043 109/76     Pulse Rate 11/08/20 1043 78     Resp 11/08/20 1043 18     Temp 11/08/20 1043 98.8 F (37.1 C)     Temp src --      SpO2 11/08/20  1043 100 %     Weight --      Height --      Head Circumference --      Peak Flow --      Pain Score 11/08/20 1041 6     Pain Loc --      Pain Edu? --      Excl. in GC? --    No data found.  Updated Vital Signs BP 109/76   Pulse 78   Temp 98.8 F (37.1 C)   Resp 18   LMP 11/08/2020   SpO2 100%   Visual Acuity Right Eye Distance:   Left Eye Distance:   Bilateral Distance:    Right Eye Near:   Left Eye Near:    Bilateral Near:     Physical Exam Vitals and nursing note reviewed.  Constitutional:      General: She is not in acute distress.    Appearance: Normal appearance. She is not ill-appearing, toxic-appearing or diaphoretic.  HENT:     Head: Normocephalic and atraumatic.     Right Ear: Hearing, tympanic membrane, ear canal and external ear normal.     Left Ear: Hearing, tympanic membrane, ear canal and external ear normal.     Mouth/Throat:     Dentition: Abnormal dentition. Dental caries and dental abscesses present.   Eyes:     Conjunctiva/sclera:  Conjunctivae normal.  Cardiovascular:     Rate and Rhythm: Normal rate.     Pulses: Normal pulses.  Pulmonary:     Effort: Pulmonary effort is normal.  Abdominal:     General: Abdomen is flat.  Musculoskeletal:        General: Normal range of motion.     Cervical back: Normal range of motion.  Skin:    General: Skin is warm and dry.  Neurological:     General: No focal deficit present.     Mental Status: She is alert and oriented to person, place, and time.  Psychiatric:        Mood and Affect: Mood normal.     UC Treatments / Results  Labs (all labs ordered are listed, but only abnormal results are displayed) Labs Reviewed - No data to display  EKG   Radiology No results found.  Procedures Procedures (including critical care time)  Medications Ordered in UC Medications - No data to display  Initial Impression / Assessment and Plan / UC Course  I have reviewed the triage vital signs and the nursing notes.  Pertinent labs & imaging results that were available during my care of the patient were reviewed by me and considered in my medical decision making (see chart for details).    Assessment negative for red flags or concerns.  Likely dental abscess due to dental caries.   Will treat with clindamycin 3 times a day for the next 7 days as patient does have an allergy to amoxicillin.   Recommend Ibuprofen and/or Tylenol as needed.  Prescribed viscous lidocaine for pain relief.  Recommend soft diet until evaluated by dentist Maintain oral hygiene care Follow up with dentist as soon as possible for further evaluation and treatment  Return or go to the ED if you have any new or worsening symptoms such as fever, chills, difficulty swallowing, painful swallowing, oral or neck swelling, nausea, vomiting, chest pain, SOB.  Reviewed expectations re: course of current medical issues. Questions answered. Outlined signs and symptoms indicating need for more acute  intervention. Patient verbalized understanding. After  Visit Summary given.  Final Clinical Impressions(s) / UC Diagnoses   Final diagnoses:  Dental abscess  Pain, dental     Discharge Instructions      Take the clindamycin three times a day for the next 7 days.  You can use Ibuprofen and/or Tylenol as needed for pain relief and fever reduction.  You can also use the viscous lidocaine for pain relief (soak a cotton ball in the lidocaine and then place it on your tooth).  Stick with a soft diet until evaluated by dentist and maintain oral hygiene care Follow up with dentist as soon as possible for further evaluation and treatment   Return or go to the ED if you have any new or worsening symptoms such as fever, chills, difficulty swallowing, painful swallowing, oral or neck swelling, nausea, vomiting, chest pain, SOB.      ED Prescriptions     Medication Sig Dispense Auth. Provider   clindamycin (CLEOCIN) 300 MG capsule Take 1 capsule (300 mg total) by mouth 3 (three) times daily for 7 days. 21 capsule Chales Salmon R, NP   lidocaine (XYLOCAINE) 2 % solution Use as directed 15 mLs in the mouth or throat as needed for mouth pain. 100 mL Ivette Loyal, NP      PDMP not reviewed this encounter.   Ivette Loyal, NP 11/08/20 1119

## 2021-01-16 ENCOUNTER — Other Ambulatory Visit: Payer: Self-pay

## 2021-01-16 ENCOUNTER — Encounter (HOSPITAL_COMMUNITY): Payer: Self-pay | Admitting: Emergency Medicine

## 2021-01-16 ENCOUNTER — Ambulatory Visit (HOSPITAL_COMMUNITY)
Admission: EM | Admit: 2021-01-16 | Discharge: 2021-01-16 | Disposition: A | Discharge status: Home / Self Care | Payer: No Typology Code available for payment source | Attending: Emergency Medicine | Admitting: Emergency Medicine

## 2021-01-16 DIAGNOSIS — N10 Acute tubulo-interstitial nephritis: Secondary | ICD-10-CM

## 2021-01-16 LAB — POC INFLUENZA A AND B ANTIGEN (URGENT CARE ONLY)
INFLUENZA A ANTIGEN, POC: NEGATIVE
INFLUENZA B ANTIGEN, POC: NEGATIVE

## 2021-01-16 LAB — POCT URINALYSIS DIPSTICK, ED / UC
Bilirubin Urine: NEGATIVE
Glucose, UA: NEGATIVE mg/dL
Ketones, ur: NEGATIVE mg/dL
Nitrite: POSITIVE — AB
Protein, ur: 30 mg/dL — AB
Specific Gravity, Urine: 1.02 (ref 1.005–1.030)
Urobilinogen, UA: 0.2 mg/dL (ref 0.0–1.0)
pH: 6 (ref 5.0–8.0)

## 2021-01-16 LAB — POC URINE PREG, ED: Preg Test, Ur: NEGATIVE

## 2021-01-16 MED ORDER — ONDANSETRON HCL 4 MG PO TABS: 4.0000 mg | ORAL_TABLET | Freq: Four times a day (QID) | ORAL | 0 refills | Status: DC

## 2021-01-16 MED ORDER — CIPROFLOXACIN HCL 500 MG PO TABS: 500.0000 mg | ORAL_TABLET | Freq: Two times a day (BID) | ORAL | 0 refills | Status: DC

## 2021-01-16 NOTE — Discharge Instructions (Addendum)
Suspect kidney infection due to your symptoms. Take antibiotics and finish full course. Take Zofran as prescribed as needed to help with nausea and vomiting. Keep hydrated. Follow-up with PCP if no improvement in a few days. Go to the ER if worsening pain, unable to keep down fluids even with the Zofran, or other new concerning symptoms.

## 2021-01-16 NOTE — ED Triage Notes (Signed)
Patient c/o ABD pain, chills, and emesis x 2 days.   Patient denies fever at home.   Patient endorses RT ear pain. Patient endorses body aches at times.   Patient endorses possible COVID exposure at work.   Patient has taken Tylenol and Pepto Bismol with some relief of symptoms.

## 2021-01-16 NOTE — ED Provider Notes (Signed)
[facility]    CSN: [id number] Arrival date & time: 01/16/21  1154      History   Chief Complaint Chief Complaint  Patient presents with   Abdominal Pain   Chills   Emesis    HPI Susan Stone is a 30 y.o. female.   Patient presents with concerns of abdominal pain, nausea, and vomiting since yesterday. She reports the abdominal pain comes and goes without any obvious trigger and is mainly in the middle. She reports some urinary frequency but denies painful urination, hematuria, discharge, or back pain. She denies diarrhea or any blood in stool or emesis. She denies known fever but has had some chills. She has taken Tylenol with minimal improvement.   The history is provided by the patient.  Abdominal Pain Associated symptoms: chills, nausea and vomiting   Associated symptoms: no constipation, no cough, no diarrhea, no dysuria, no fever, no hematuria and no vaginal discharge   Emesis Associated symptoms: abdominal pain and chills   Associated symptoms: no cough, no diarrhea, no fever, no headaches and no myalgias    History reviewed. No pertinent past medical history.  Patient Active Problem List   Diagnosis Date Noted   OVERWEIGHT 12/05/2008   ANEMIA 12/05/2008    Past Surgical History:  Procedure Laterality Date   NO PAST SURGERIES      OB History     Gravida  2   Para  2   Term  2   Preterm      AB      Living  2      SAB      IAB      Ectopic      Multiple  0   Live Births  2            Home Medications    Prior to Admission medications   Medication Sig Start Date End Date Taking? Authorizing Provider  ciprofloxacin (CIPRO) 500 MG tablet Take 1 tablet (500 mg total) by mouth every 12 (twelve) hours. 01/16/21  Yes Albertus Chiarelli L, PA  ondansetron (ZOFRAN) 4 MG tablet Take 1 tablet (4 mg total) by mouth every 6 (six) hours. 01/16/21  Yes Clarrissa Shimkus L, PA  azelastine (OPTIVAR) 0.05 % ophthalmic solution Place 1 drop into  both eyes 2 (two) times daily. 08/10/18   Wallis Bamberg, PA-C  lidocaine (XYLOCAINE) 2 % solution Use as directed 15 mLs in the mouth or throat as needed for mouth pain. 11/08/20   Ivette Loyal, NP  nitrofurantoin, macrocrystal-monohydrate, (MACROBID) 100 MG capsule Take 1 capsule (100 mg total) by mouth 2 (two) times daily. 02/06/20   Merrilee Jansky, MD  polyethylene glycol powder [facility]) 17 GM/SCOOP powder Take one scoopful in 8 oz twice daily 11/18/18   Elvina Sidle, MD  predniSONE (DELTASONE) 50 MG tablet Take 1 tablet (50 mg total) by mouth daily with breakfast. 07/28/20   Belinda Fisher, PA-C    Family History Family History  Problem Relation Age of Onset   Hypertension Maternal Grandmother    Healthy Mother    Healthy Father     Social History Social History   Tobacco Use   Smoking status: Never   Smokeless tobacco: Never  Substance Use Topics   Alcohol use: No   Drug use: No     Allergies   Amoxicillin   Review of Systems Review of Systems  Constitutional:  Positive for appetite change and chills. Negative for fever.  Respiratory:  Negative for cough.   Gastrointestinal:  Positive for abdominal pain, nausea and vomiting. Negative for blood in stool, constipation and diarrhea.  Genitourinary:  Positive for frequency. Negative for dysuria, flank pain, hematuria, urgency and vaginal discharge.  Musculoskeletal:  Negative for back pain and myalgias.  Skin:  Negative for rash.  Neurological:  Negative for headaches.    Physical Exam Triage Vital Signs ED Triage Vitals  Enc Vitals Group     BP 01/16/21 1356 (!) 142/96     Pulse Rate 01/16/21 1356 71     Resp 01/16/21 1356 18     Temp 01/16/21 1356 99 F (37.2 C)     Temp Source 01/16/21 1356 Oral     SpO2 01/16/21 1356 100 %     Weight --      Height --      Head Circumference --      Peak Flow --      Pain Score 01/16/21 1358 8     Pain Loc --      Pain Edu? --      Excl. in [place]? --    No data  found.  Updated Vital Signs BP (!) 142/96 (BP Location: Left Arm)   Pulse 71   Temp 99 F (37.2 C) (Oral)   Resp 18   LMP 12/28/2020 (Exact Date)   SpO2 100%   Visual Acuity Right Eye Distance:   Left Eye Distance:   Bilateral Distance:    Right Eye Near:   Left Eye Near:    Bilateral Near:     Physical Exam Vitals and nursing note reviewed.  Constitutional:      General: She is not in acute distress. HENT:     Head: Normocephalic.  Eyes:     Conjunctiva/sclera: Conjunctivae normal.     Pupils: Pupils are equal, round, and reactive to light.  Cardiovascular:     Rate and Rhythm: Normal rate and regular rhythm.     Heart sounds: Normal heart sounds.  Pulmonary:     Effort: Pulmonary effort is normal.     Breath sounds: Normal breath sounds.  Abdominal:     General: There is no distension.     Palpations: Abdomen is soft.     Tenderness: There is abdominal tenderness (epigastric, umbilical, and suprapubic - reports suprapubic worst at this time). There is no right CVA tenderness, left CVA tenderness, guarding or rebound.  Skin:    General: Skin is warm.     Findings: No rash.  Neurological:     Mental Status: She is alert.  Psychiatric:        Mood and Affect: Mood normal.     UC Treatments / Results  Labs (all labs ordered are listed, but only abnormal results are displayed) Labs Reviewed  POCT URINALYSIS DIPSTICK, ED / UC - Abnormal; Notable for the following components:      Result Value   Hgb urine dipstick SMALL (*)    Protein, ur 30 (*)    Nitrite POSITIVE (*)    Leukocytes,Ua LARGE (*)    All other components within normal limits  URINE CULTURE  POC URINE PREG, ED  POC INFLUENZA A AND B ANTIGEN (URGENT CARE ONLY)  POCT URINALYSIS DIPSTICK, ED / UC  POC URINE PREG, ED    EKG   Radiology No results found.  Procedures Procedures (including critical care time)  Medications Ordered in UC Medications - No data to display  Initial  Impression /  Assessment and Plan / UC Course  I have reviewed the triage vital signs and the nursing notes.  Pertinent labs & imaging results that were available during my care of the patient were reviewed by me and considered in my medical decision making (see chart for details).     Denies significant urinary sx but given +U/A, nausea/vomiting, and suprapubic tenderness concern for pyelo. Will tx empirically with cipro given allergy to amoxicillin restricting Rocephin use. Will also rx Zofran for sx tx of nausea. Discussed close followup with PCP, ER precautions.   E/M: 1 acute complicated illness, 3 data (flu, UA, preg), moderate risk due to prescription management  Final Clinical Impressions(s) / UC Diagnoses   Final diagnoses:  Acute pyelonephritis     Discharge Instructions      Suspect kidney infection due to your symptoms. Take antibiotics and finish full course. Take Zofran as prescribed as needed to help with nausea and vomiting. Keep hydrated. Follow-up with PCP if no improvement in a few days. Go to the ER if worsening pain, unable to keep down fluids even with the Zofran, or other new concerning symptoms.      ED Prescriptions     Medication Sig Dispense Auth. Provider   ciprofloxacin (CIPRO) 500 MG tablet Take 1 tablet (500 mg total) by mouth every 12 (twelve) hours. 10 tablet Abner Greenspan, Lakishia Bourassa L, PA   ondansetron (ZOFRAN) 4 MG tablet Take 1 tablet (4 mg total) by mouth every 6 (six) hours. 12 tablet Abner Greenspan, Tyrell Seifer L, PA      PDMP not reviewed this encounter.   Delsa Sale, [place] 01/16/21 1451

## 2021-01-19 ENCOUNTER — Telehealth (HOSPITAL_COMMUNITY): Payer: Self-pay | Admitting: Emergency Medicine

## 2021-01-19 LAB — URINE CULTURE: Culture: >=100000 — AB

## 2021-01-20 MED ORDER — CEPHALEXIN 500 MG PO CAPS: 500.0000 mg | ORAL_CAPSULE | Freq: Two times a day (BID) | ORAL | 0 refills | Status: AC

## 2021-07-16 ENCOUNTER — Ambulatory Visit: Payer: No Typology Code available for payment source

## 2021-07-21 NOTE — Progress Notes (Signed)
    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 Requires a letter that states that she is in good health. She also needs [employer] for work. She denies any past medical history or surgical history.  Only daily medication is Zyrtec for allergies.  She is able to lift without restrictions.  Health Maintenance Will offer COVID vaccine booster and Hep C screening.  PERTINENT  PMH / PSH: No past medical history on file.   OBJECTIVE:   BP 120/78   Pulse 72   Ht 5\' 4"  (1.626 m)   Wt 193 lb 4 oz (87.7 kg)   LMP 06/30/2021   SpO2 100%   BMI 33.17 kg/m    General: NAD, pleasant, able to participate in exam Cardiac: RRR, no murmurs. Respiratory: CTAB, normal effort, No wheezes, rales or rhonchi Abdomen: Bowel sounds present, nontender, nondistended Extremities: no edema or cyanosis. Skin: warm and dry, no rashes noted Neuro: alert, no obvious focal deficits Psych: Normal affect and mood  ASSESSMENT/PLAN:   ANEMIA History of anemia on chart review. She is asymptomatic. Hemoglobin 9.4 in 2018, no recent checks.  She does not take any iron supplementation.  We will recheck CBC and ferritin today.  Screening for tuberculosis [employer] today for screening purposes. Asymptomatic and low-risk patient.   Works in [place] facility Works as a Teacher, music. Able to work without restrictions. Letter provided to patient today.  Healthcare maintenance Hepatitis C screening today.  Declines COVID-vaccine booster.     Lawyer, DO [facility]

## 2021-07-24 ENCOUNTER — Ambulatory Visit (INDEPENDENT_AMBULATORY_CARE_PROVIDER_SITE_OTHER): Payer: Medicaid Other | Admitting: Family Medicine

## 2021-07-24 ENCOUNTER — Encounter: Payer: Self-pay | Admitting: Family Medicine

## 2021-07-24 VITALS — BP 120/78 | HR 72 | Ht 64.0 in | Wt 193.2 lb

## 2021-07-24 DIAGNOSIS — Z0000 Encounter for general adult medical examination without abnormal findings: Secondary | ICD-10-CM | POA: Insufficient documentation

## 2021-07-24 DIAGNOSIS — Z789 Other specified health status: Secondary | ICD-10-CM | POA: Diagnosis not present

## 2021-07-24 DIAGNOSIS — D649 Anemia, unspecified: Secondary | ICD-10-CM

## 2021-07-24 DIAGNOSIS — Z1159 Encounter for screening for other viral diseases: Secondary | ICD-10-CM

## 2021-07-24 DIAGNOSIS — Z111 Encounter for screening for respiratory tuberculosis: Secondary | ICD-10-CM | POA: Insufficient documentation

## 2021-07-24 NOTE — Addendum Note (Signed)
Addended by: Jennette Bill on: 07/24/2021 09:00 AM   Modules accepted: Orders

## 2021-07-24 NOTE — Assessment & Plan Note (Signed)
[employer] gold today for screening purposes. Asymptomatic and low-risk patient.

## 2021-07-24 NOTE — Patient Instructions (Addendum)
It was wonderful to see you today.  Please bring ALL of your medications with you to every visit.   Today we talked about:  -We are doing lab work today to test for tuberculosis. I will send you a message on MyChart with the results. -I have provided you a work letter.   Thank you for choosing [facility].   Please call [phone] with any questions about today's appointment.  Please be sure to schedule follow up at the front  desk before you leave today.   Sabino Dick, DO PGY-2 Family Medicine

## 2021-07-24 NOTE — Assessment & Plan Note (Signed)
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

## 2021-07-24 NOTE — Assessment & Plan Note (Signed)
History of anemia on chart review. She is asymptomatic. Hemoglobin 9.4 in 2018, no recent checks.  She does not take any iron supplementation.  We will recheck CBC and ferritin today.

## 2021-07-24 NOTE — Assessment & Plan Note (Signed)
Hepatitis C screening today.  Declines COVID-vaccine booster.

## 2021-07-25 LAB — CBC
Hematocrit: 36.9 % (ref 34.0–46.6)
Hemoglobin: 11.3 g/dL (ref 11.1–15.9)
MCH: 24.8 pg — ABNORMAL LOW (ref 26.6–33.0)
MCHC: 30.6 g/dL — ABNORMAL LOW (ref 31.5–35.7)
MCV: 81 fL (ref 79–97)
Platelets: 276 10*3/uL (ref 150–450)
RBC: 4.56 x10E6/uL (ref 3.77–5.28)
RDW: 13.7 % (ref 11.7–15.4)
WBC: 6 10*3/uL (ref 3.4–10.8)

## 2021-07-25 LAB — FERRITIN: Ferritin: 33 ng/mL (ref 15–150)

## 2021-07-30 LAB — QUANTIFERON-TB GOLD PLUS
QuantiFERON Mitogen Value: >10 IU/mL
QuantiFERON Nil Value: 0.35 IU/mL
QuantiFERON TB1 Ag Value: 0.23 IU/mL
QuantiFERON TB2 Ag Value: 0.24 IU/mL
QuantiFERON-TB Gold Plus: NEGATIVE

## 2021-07-30 LAB — HCV INTERPRETATION

## 2021-07-30 LAB — HCV AB W REFLEX TO QUANT PCR: HCV Ab: NONREACTIVE

## 2021-08-11 ENCOUNTER — Encounter (HOSPITAL_COMMUNITY): Payer: Self-pay

## 2021-08-11 ENCOUNTER — Emergency Department (HOSPITAL_COMMUNITY)
Admission: EM | Admit: 2021-08-11 | Discharge: 2021-08-11 | Disposition: A | Discharge status: Home / Self Care | Payer: Medicaid Other | Attending: Emergency Medicine | Admitting: Emergency Medicine

## 2021-08-11 ENCOUNTER — Encounter: Payer: Self-pay | Admitting: *Deleted

## 2021-08-11 ENCOUNTER — Other Ambulatory Visit: Payer: Self-pay

## 2021-08-11 DIAGNOSIS — J3489 Other specified disorders of nose and nasal sinuses: Secondary | ICD-10-CM | POA: Insufficient documentation

## 2021-08-11 DIAGNOSIS — J029 Acute pharyngitis, unspecified: Secondary | ICD-10-CM | POA: Diagnosis not present

## 2021-08-11 DIAGNOSIS — R059 Cough, unspecified: Secondary | ICD-10-CM | POA: Insufficient documentation

## 2021-08-11 DIAGNOSIS — R051 Acute cough: Secondary | ICD-10-CM

## 2021-08-11 LAB — GROUP A STREP BY PCR: Group A Strep by PCR: NOT DETECTED

## 2021-08-11 MED ORDER — LIDOCAINE VISCOUS HCL 2 % MT SOLN
15.0000 mL | Freq: Once | OROMUCOSAL | Status: AC
  Administered 2021-08-11: 15 mL via OROMUCOSAL
  Filled 2021-08-11: qty 15

## 2021-08-11 MED ORDER — LIDOCAINE VISCOUS HCL 2 % MT SOLN
15.0000 mL | Freq: Four times a day (QID) | OROMUCOSAL | 0 refills | Status: DC | PRN
Start: 2021-08-11 — End: 2023-12-05

## 2021-08-11 NOTE — ED Provider Notes (Signed)
[facility] EMERGENCY DEPARTMENT Provider Note   CSN: [id number] Arrival date & time: 08/11/21  6720     History  No chief complaint on file.   Susan Stone is a 31 y.o. female with past medical history of seasonal allergies.  Presents emergency department with a chief complaint of rhinorrhea, nasal congestion, and cough.  Patient reports that her symptoms started on Saturday.  Symptoms have been persistent since then.  Patient reports that cough is minimally productive, when producing mucus is clear in color.  Patient also reports seeing clear mucus from nasal passages.  Sore throat is constant and worse with p.o. intake.  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  Denies any fever, chills, trismus, drooling, hot potato voice, neck pain, neck stiffness, nasal congestion, shortness of breath, chest pain, abdominal pain, nausea, vomiting, diarrhea.  HPI     Home Medications Prior to Admission medications   Medication Sig Start Date End Date Taking? Authorizing Provider  cetirizine (ZYRTEC) 10 MG tablet Take 10 mg by mouth daily.    [provider]      Allergies    Amoxicillin    Review of Systems   Review of Systems  Constitutional:  Negative for chills and fever.  HENT:  Positive for rhinorrhea and sore throat. Negative for congestion, drooling, facial swelling, trouble swallowing and voice change.   Respiratory:  Positive for cough. Negative for shortness of breath.   Cardiovascular:  Negative for chest pain.  Gastrointestinal:  Negative for abdominal pain, diarrhea, nausea and vomiting.  Genitourinary:  Negative for difficulty urinating and dysuria.  Musculoskeletal:  Negative for back pain and neck pain.  Skin:  Negative for color change and rash.  Neurological:  Negative for dizziness, syncope, light-headedness and headaches.  Psychiatric/Behavioral:  Negative for confusion.    Physical Exam Updated Vital  Signs BP 127/83 (BP Location: Right Arm)   Pulse 79   Temp 98.5 F (36.9 C) (Oral)   Resp 16   SpO2 100%  Physical Exam Vitals and nursing note reviewed.  Constitutional:      General: She is not in acute distress.    Appearance: She is not ill-appearing, toxic-appearing or diaphoretic.  HENT:     Head: Normocephalic.     Jaw: There is normal jaw occlusion. No trismus, tenderness, swelling, pain on movement or malocclusion.     Comments: No facial swelling    Mouth/Throat:     Lips: Pink. No lesions.     Mouth: Mucous membranes are moist.     Tongue: No lesions. Tongue does not deviate from midline.     Palate: No mass and lesions.     Pharynx: Oropharynx is clear. Uvula midline. No pharyngeal swelling, oropharyngeal exudate, posterior oropharyngeal erythema or uvula swelling.     Tonsils: No tonsillar exudate or tonsillar abscesses. 2+ on the right. 2+ on the left.     Comments: No erythema or exudate noted to tonsils bilaterally.  Handles oral secretions without difficulty.  No swelling to floor of mouth. Eyes:     General: No scleral icterus.       Right eye: No discharge.        Left eye: No discharge.  Neck:     Comments: No swelling to submandibular space Cardiovascular:     Rate and Rhythm: Normal rate.  Pulmonary:     Effort: Pulmonary effort is normal. No tachypnea, bradypnea or respiratory distress.  Breath sounds: Normal breath sounds. No stridor.     Comments: Speaks in full complete sentences without difficulty Musculoskeletal:     Cervical back: Full passive range of motion without pain, normal range of motion and neck supple. No edema, erythema, signs of trauma, rigidity, torticollis or crepitus. No pain with movement, spinous process tenderness or muscular tenderness. Normal range of motion.  Lymphadenopathy:     Cervical: Cervical adenopathy present.     Right cervical: Superficial cervical adenopathy present.     Left cervical: Superficial cervical  adenopathy present.  Skin:    General: Skin is warm and dry.  Neurological:     General: No focal deficit present.     Mental Status: She is alert.  Psychiatric:        Behavior: Behavior is cooperative.    ED Results / Procedures / Treatments   Labs (all labs ordered are listed, but only abnormal results are displayed) Labs Reviewed  GROUP A STREP BY PCR    EKG None  Radiology No results found.  Procedures Procedures    Medications Ordered in ED Medications  lidocaine (XYLOCAINE) 2 % viscous mouth solution 15 mL (has no administration in time range)    ED Course/ Medical Decision Making/ A&P                           Medical Decision Making Risk Prescription drug management.   Alert 31 year old female no acute distress, nontoxic-appearing.  Presents to ED with a chief complaint of rhinorrhea, sore throat, cough.  Information obtained from patient.  I reviewed patient's past medical records including previous prior notes, labs, and imaging.  Patient has medical history as outlined in HPI.  With reports of sore throat will swab patient for strep pharyngitis.  Patient was offered testing for COVID-19 and influenza however refusing stating that she had negative testing at work over the last 2 days.  Patient was offered testing for mononucleosis however declines at this time.  Low suspicion for deep space neck infection or Ludwick's angina as patient has no pain with passive range of motion, swelling to submandibular space or floor of mouth, fever or difficulty swallowing.  Low suspicion for pneumonia as patient lungs clear to auscultation bilaterally and patient is afebrile.  I personally viewed and interpreted patient's lab results.  Pertinent findings include negative for strep pharyngitis.  Patient given viscous lidocaine for sore throat.  Patient had some improvement in sore throat after receiving this medication.  Suspect that patient's symptoms are due to viral  upper respiratory infection.  Discussed symptomatic treatment with patient.  Patient given a prescription for viscous lidocaine.  Patient to follow-up with PCP if symptoms do not improve.  Based on patient's chief complaint, I considered admission might be necessary, however after reassuring ED workup feel patient is reasonable for discharge.  Discussed results, findings, treatment and follow up. Patient advised of return precautions. Patient verbalized understanding and agreed with plan.  Portions of this note were generated with Scientist, clinical (histocompatibility and immunogenetics). Dictation errors may occur despite best attempts at proofreading.         Final Clinical Impression(s) / ED Diagnoses Final diagnoses:  None    Rx / DC Orders ED Discharge Orders     None         Haskel Schroeder, PA-C 08/11/21 1131    Margarita Grizzle, MD 08/12/21 [phone]

## 2021-08-11 NOTE — ED Triage Notes (Signed)
Patient complains of sore throat, congestion and cough since Saturday. No relief with otc meds, no fever

## 2021-08-11 NOTE — Discharge Instructions (Signed)
You came to the emergency department today to be evaluated for your symptoms, you were negative for strep pharyngitis.  You declined testing for COVID-19, influenza, or mononucleosis.  Your symptoms may be due to a viral upper respiratory infection and if this is the case should improve over time.  Please take Tylenol or ibuprofen as listed below.  Additionally have given you prescription for viscous lidocaine to take for your sore throat.  Please take Ibuprofen (Advil, motrin) and Tylenol (acetaminophen) to relieve your pain.    You may take up to 600 MG (3 pills) of normal strength ibuprofen every 8 hours as needed.   You make take tylenol, up to 1,000 mg (two extra strength pills) every 8 hours as needed.   It is safe to take ibuprofen and tylenol at the same time as they work differently.   Do not take more than 3,000 mg tylenol in a 24 hour period (not more than one dose every 8 hours.  Please check all medication labels as many medications such as pain and cold medications may contain tylenol.  Do not drink alcohol while taking these medications.  Do not take other NSAID'S while taking ibuprofen (such as aleve or naproxen).  Please take ibuprofen with food to decrease stomach upset.  Get help right away if: You have shortness of breath that gets worse. You have severe or persistent: Headache. Ear pain. Sinus pain. Chest pain. You have chronic lung disease along with any of the following: Making high-pitched whistling sounds when you breathe, most often when you breathe out (wheezing). Prolonged cough (more than 14 days). Coughing up blood. A change in your usual mucus. You have a stiff neck. You have changes in your: Vision. Hearing. Thinking. Mood.

## 2021-08-28 ENCOUNTER — Telehealth: Payer: Self-pay

## 2022-04-24 ENCOUNTER — Encounter (HOSPITAL_BASED_OUTPATIENT_CLINIC_OR_DEPARTMENT_OTHER): Payer: Self-pay

## 2022-04-24 ENCOUNTER — Other Ambulatory Visit: Payer: Self-pay

## 2022-04-24 ENCOUNTER — Emergency Department (HOSPITAL_BASED_OUTPATIENT_CLINIC_OR_DEPARTMENT_OTHER)
Admission: EM | Admit: 2022-04-24 | Discharge: 2022-04-24 | Disposition: A | Discharge status: Home / Self Care | Payer: Medicaid Other | Attending: Emergency Medicine | Admitting: Emergency Medicine

## 2022-04-24 DIAGNOSIS — Z1152 Encounter for screening for COVID-19: Secondary | ICD-10-CM | POA: Insufficient documentation

## 2022-04-24 DIAGNOSIS — J039 Acute tonsillitis, unspecified: Secondary | ICD-10-CM

## 2022-04-24 LAB — GROUP A STREP BY PCR: Group A Strep by PCR: DETECTED — AB

## 2022-04-24 LAB — RESP PANEL BY RT-PCR (RSV, FLU A&B, COVID)  RVPGX2
Influenza A by PCR: NEGATIVE
Influenza B by PCR: NEGATIVE
Resp Syncytial Virus by PCR: NEGATIVE
SARS Coronavirus 2 by RT PCR: NEGATIVE

## 2022-04-24 MED ORDER — DEXAMETHASONE 4 MG PO TABS
16.0000 mg | ORAL_TABLET | Freq: Once | ORAL | Status: AC
Start: 2022-04-24 — End: 2022-04-24
  Administered 2022-04-24: 16 mg via ORAL
  Filled 2022-04-24: qty 4

## 2022-04-24 MED ORDER — CLINDAMYCIN HCL 150 MG PO CAPS: 300.0000 mg | ORAL_CAPSULE | Freq: Three times a day (TID) | ORAL | 0 refills | Status: AC

## 2022-04-24 NOTE — Discharge Instructions (Signed)
Your strep test and COVID/flu/RSV tests are pending.  I have given you a prescription for clindamycin which will treat you if you have strep (given your amoxicillin allergy) and can also treat other bacterial throat infections. I do not see signs of abscess at this time. You received a dose of longer acting steroids here that will help with inflammation. Continue to use over the counter medications such as ibuprofen, tylenol and throat lozenges for pain and inflammation. We hope you feel better soon!

## 2022-04-24 NOTE — ED Triage Notes (Signed)
Pt to er room number 10, pt reports that Thursday she started feeling poorly, states that then she developed a sore throat, states that the sore throat continues and it hurts to swallow, pt denies cough, reported some ear pain that has also resolved, resps even and unlabored.

## 2022-04-24 NOTE — ED Provider Notes (Signed)
[place] [facility] [place] [facility] Provider Note   CSN: [id number] Arrival date & time: 04/24/22  0740     History  Chief Complaint  Patient presents with   Sore Throat    Susan Stone is a 32 y.o. female.  HPI     32yo female with no significant medical history presents with concern for sore throat.   Reports her sore throat started on Thursday.  Reports his bilateral sore throat.  Yesterday had pain that radiated to her ears briefly, but reports the ear pain has resolved today.  Has some nasal congestion today.  Denies any known fevers or chills.  Denies body aches, cough, shortness of breath, chest pain, drooling, dysphagia.  Reports that she is able to eat and drink however is very painful.  She is tried over-the-counter treatments including gargling.  Does not have history of recurrent infections.  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  Her daughter was last sick 3 weeks ago.  History reviewed. No pertinent past medical history.   Home Medications Prior to Admission medications   Medication Sig Start Date End Date Taking? Authorizing Provider  clindamycin (CLEOCIN) 150 MG capsule Take 2 capsules (300 mg total) by mouth 3 (three) times daily for 10 days. 04/24/22 05/04/22 Yes Gareth Morgan, MD  cetirizine (ZYRTEC) 10 MG tablet Take 10 mg by mouth daily.    [provider]  lidocaine (XYLOCAINE) 2 % solution Use as directed 15 mLs in the mouth or throat every 6 (six) hours as needed for mouth pain. 08/11/21   Loni Beckwith, PA-C      Allergies    Amoxicillin    Review of Systems   Review of Systems  Physical Exam Updated Vital Signs BP 135/83 (BP Location: Right Arm)   Pulse (!) 101   Temp 99.1 F (37.3 C) (Oral)   Resp 18   Ht 5' 3"$  (1.6 m)   Wt 81.6 kg   SpO2 100%   BMI 31.89 kg/m  Physical Exam Vitals and nursing note reviewed.  Constitutional:      General: She is not in acute  distress.    Appearance: She is well-developed. She is not diaphoretic.  HENT:     Head: Normocephalic and atraumatic.     Comments: Right TM obstructed by cerumen, left TM partially visualized normal     Mouth/Throat:     Mouth: Mucous membranes are moist.     Pharynx: Posterior oropharyngeal erythema present. No oropharyngeal exudate or uvula swelling.     Comments: Tonsillary hypertrophy bilaterally, no peritonsillar swelling, erythema of throat, no sign of exudate at this time  Eyes:     Conjunctiva/sclera: Conjunctivae normal.  Cardiovascular:     Rate and Rhythm: Normal rate and regular rhythm.     Heart sounds: Normal heart sounds. No murmur heard.    No friction rub. No gallop.  Pulmonary:     Effort: Pulmonary effort is normal. No respiratory distress.     Breath sounds: Normal breath sounds. No stridor. No wheezing or rales.  Abdominal:     General: There is no distension.     Palpations: Abdomen is soft.     Tenderness: There is no abdominal tenderness. There is no guarding.  Musculoskeletal:        General: No tenderness.     Cervical back: Normal range of motion.  Skin:    General: Skin is warm and  dry.     Findings: No erythema or rash.  Neurological:     Mental Status: She is alert and oriented to person, place, and time.     ED Results / Procedures / Treatments   Labs (all labs ordered are listed, but only abnormal results are displayed) Labs Reviewed  GROUP A STREP BY PCR  RESP PANEL BY RT-PCR (RSV, FLU A&B, COVID)  [id number]    EKG None  Radiology No results found.  Procedures Procedures    Medications Ordered in ED Medications  dexamethasone (DECADRON) tablet 16 mg (has no administration in time range)    ED Course/ Medical Decision Making/ A&P                             Medical Decision Making Risk Prescription drug management.    32yo female with no significant medical history presents with concern for sore throat.   Does not  have clinical findings to suggest epiglottitis, RPA, PTA.  Has bilateral tonsillar hypertrophy.  Strep screening, COVID/influenza/RSV testing done and pending at time of discharge to evaluate for etiology of symptoms.    Given erythema, tonsillar swelling, will empirically treat with decadron and clindamycin (hx of penicillin allergy) for possible bacterial/strep tonsillitis.   Recommend continued supportive care, hydration, tylenol/ibuprofen, throat lozenges. Discussed return precautions. Patient discharged in stable condition with understanding of reasons to return.         Final Clinical Impression(s) / ED Diagnoses Final diagnoses:  Tonsillitis    Rx / DC Orders ED Discharge Orders          Ordered    clindamycin (CLEOCIN) 150 MG capsule  3 times daily        04/24/22 [id number]              Gareth Morgan, MD 04/24/22 [phone]

## 2022-05-27 ENCOUNTER — Other Ambulatory Visit: Payer: Self-pay

## 2022-05-27 ENCOUNTER — Emergency Department (HOSPITAL_BASED_OUTPATIENT_CLINIC_OR_DEPARTMENT_OTHER)
Admission: EM | Admit: 2022-05-27 | Discharge: 2022-05-27 | Disposition: A | Discharge status: Home / Self Care | Payer: Medicaid Other | Attending: Emergency Medicine | Admitting: Emergency Medicine

## 2022-05-27 ENCOUNTER — Encounter (HOSPITAL_BASED_OUTPATIENT_CLINIC_OR_DEPARTMENT_OTHER): Payer: Self-pay

## 2022-05-27 DIAGNOSIS — L02219 Cutaneous abscess of trunk, unspecified: Secondary | ICD-10-CM | POA: Insufficient documentation

## 2022-05-27 DIAGNOSIS — L0291 Cutaneous abscess, unspecified: Secondary | ICD-10-CM

## 2022-05-27 LAB — PREGNANCY, URINE: Preg Test, Ur: NEGATIVE

## 2022-05-27 MED ORDER — LIDOCAINE-EPINEPHRINE 2 %-1:100000 IJ SOLN: 20.0000 mL | Freq: Once | INTRAMUSCULAR | Status: DC

## 2022-05-27 MED ORDER — SULFAMETHOXAZOLE-TRIMETHOPRIM 800-160 MG PO TABS
1.0000 | ORAL_TABLET | Freq: Once | ORAL | Status: AC
  Administered 2022-05-27: 1 via ORAL
  Filled 2022-05-27: qty 1

## 2022-05-27 MED ORDER — HYDROCODONE-ACETAMINOPHEN 5-325 MG PO TABS
1.0000 | ORAL_TABLET | Freq: Once | ORAL | Status: AC
  Administered 2022-05-27: 1 via ORAL
  Filled 2022-05-27: qty 1

## 2022-05-27 MED ORDER — SULFAMETHOXAZOLE-TRIMETHOPRIM 800-160 MG PO TABS: 1.0000 | ORAL_TABLET | Freq: Two times a day (BID) | ORAL | 0 refills | Status: AC

## 2022-05-27 MED ORDER — LIDOCAINE-EPINEPHRINE (PF) 2 %-1:200000 IJ SOLN
INTRAMUSCULAR | Status: AC
  Administered 2022-05-27: 20 mL
  Filled 2022-05-27: qty 20

## 2022-05-27 MED ORDER — HYDROCODONE-ACETAMINOPHEN 5-325 MG PO TABS: 1.0000 | ORAL_TABLET | Freq: Four times a day (QID) | ORAL | 0 refills | Status: DC | PRN

## 2022-05-27 MED ORDER — LIDOCAINE-EPINEPHRINE (PF) 2 %-1:200000 IJ SOLN: 20.0000 mL | Freq: Once | INTRAMUSCULAR | Status: AC

## 2022-05-27 NOTE — ED Provider Notes (Signed)
[place] [facility] [place] [facility] Provider Note   CSN: [id number] Arrival date & time: 05/27/22  0940     History  Chief Complaint  Patient presents with   Abscess    Susan Stone is a 32 y.o. female.  HPI   32 year old female with past medical history of abscess/boils presents emergency department with swelling in her left groin.  Patient states that this started about 3 days ago.  Its gotten slightly bigger, more red and is painful.  She denies any systemic symptoms.  She has required incision and drainage before but typically takes antibiotics with resolution.  Home Medications Prior to Admission medications   Medication Sig Start Date End Date Taking? Authorizing Provider  cetirizine (ZYRTEC) 10 MG tablet Take 10 mg by mouth daily.    [provider]  lidocaine (XYLOCAINE) 2 % solution Use as directed 15 mLs in the mouth or throat every 6 (six) hours as needed for mouth pain. 08/11/21   Loni Beckwith, PA-C      Allergies    Amoxicillin    Review of Systems   Review of Systems  Constitutional:  Negative for chills and fever.  Respiratory:  Negative for shortness of breath.   Cardiovascular:  Negative for chest pain.  Gastrointestinal:  Negative for abdominal pain.  Genitourinary:  Negative for pelvic pain, vaginal bleeding, vaginal discharge and vaginal pain.  Skin:        Groin swelling/abscess    3  Physical Exam Updated Vital Signs BP 130/71 (BP Location: Right Arm)   Pulse 75   Temp 98.3 F (36.8 C) (Oral)   Resp 18   Ht 5\' 3"  (1.6 m)   Wt 86.2 kg   SpO2 100%   BMI 33.66 kg/m  Physical Exam Constitutional:      General: She is not in acute distress. HENT:     Head: Normocephalic.  Cardiovascular:     Rate and Rhythm: Normal rate.  Pulmonary:     Effort: Pulmonary effort is normal.  Abdominal:     Tenderness: There is no abdominal tenderness.       Comments: Indurated area with central fluctuance, non  pulsatile, no erythema tracking along the groin or down involving the vagina.  No swelling of the labia, no extension to the thigh  Skin:    General: Skin is warm.  Neurological:     Mental Status: She is alert. Mental status is at baseline.     ED Results / Procedures / Treatments   Labs (all labs ordered are listed, but only abnormal results are displayed) Labs Reviewed  PREGNANCY, URINE    EKG None  Radiology No results found.  Procedures .Marland KitchenIncision and Drainage  Date/Time: 05/27/2022 12:18 PM  Performed by: Lorelle Gibbs, DO Authorized by: Lorelle Gibbs, DO   Consent:    Consent obtained:  Verbal   Consent given by:  Patient   Risks discussed:  Bleeding, infection, incomplete drainage, pain and damage to other organs   Alternatives discussed:  No treatment and delayed treatment Location:    Type:  Abscess   Location:  Trunk   Trunk location:  Abdomen (left lower abdomen/groin) Sedation:    Sedation type:  None Anesthesia:    Anesthesia method:  Local infiltration   Local anesthetic:  Lidocaine 2% WITH epi Procedure type:    Complexity:  Simple Procedure details:    Ultrasound guidance: yes     Needle aspiration: no  Incision types:  Stab incision and single straight   Incision depth:  Subcutaneous   Wound management:  Probed and deloculated and irrigated with saline   Drainage:  Purulent   Drainage amount:  Moderate   Wound treatment:  Wound left open   Packing materials:  None Post-procedure details:    Procedure completion:  Tolerated     Medications Ordered in ED Medications  lidocaine-EPINEPHrine (XYLOCAINE W/EPI) 2 %-1:200000 (PF) injection 20 mL (20 mLs Infiltration Given by Other 05/27/22 1101)    ED Course/ Medical Decision Making/ A&P                             Medical Decision Making Amount and/or Complexity of Data Reviewed Labs: ordered.  Risk Prescription drug management.   32 year old female presents emergency  department with abscess/swelling around her left groin.  Vitals are stable on arrival.  Physical exam shows a superficial indurated area just above and medial to the left groin with a central fluctuance area.  This area is nonpulsatile, not fully in the groin or around vasculature.  Bedside ultrasound shows a focal area of fluid, no vascular findings in this evaluation.  Bedside incision and drainage was performed, moderate amount of purulent foul-smelling drainage.  Patient tolerated well.  Will treat with oral antibiotics with strict return to ED precautions.  No signs that this infection is tracking deeper at this time.  Patient at this time appears safe and stable for discharge and close outpatient follow up. Discharge plan and strict return to ED precautions discussed, patient verbalizes understanding and agreement.        Final Clinical Impression(s) / ED Diagnoses Final diagnoses:  None    Rx / DC Orders ED Discharge Orders     None         Lorelle Gibbs, DO 05/27/22 1220

## 2022-05-27 NOTE — ED Notes (Signed)
Pt in bed, dressing placed per md instructions, pt states that she has a slight decrease in pain, pt states that she is ready to go home, verbalized understanding d/c and follow up pt from dpt.

## 2022-05-27 NOTE — Discharge Instructions (Signed)
You have been seen and discharged from the emergency department.  You were found to have an abscess around her groin.  This was treated with incision and drainage.  You need to take antibiotics as directed.  Take pain medicine as needed.  Do not mix this medication with alcohol or other sedating medications. Do not drive or do heavy physical activity until you know how this medication affects you.  It may cause drowsiness.  Change the wound dressing once a day for the next 3 days.  Then you may use a Band-Aid or let the wound heal to air.  Follow-up with your primary provider for further evaluation and further care. Take home medications as prescribed. If you have any worsening symptoms, worsening swelling, worsening redness, fever/chills or further concerns for your health please return to an emergency department for further evaluation.

## 2022-05-27 NOTE — ED Triage Notes (Signed)
Pt to er, pt states that for the past 3-4 days she has been developing an abscess/boil on her groin, states that she has had some in the past, states that she is here today because it is bigger and more painful.

## 2022-07-13 ENCOUNTER — Encounter: Payer: Medicaid Other | Admitting: Family Medicine

## 2022-09-15 ENCOUNTER — Encounter: Payer: Self-pay | Admitting: Student

## 2022-09-15 ENCOUNTER — Ambulatory Visit (INDEPENDENT_AMBULATORY_CARE_PROVIDER_SITE_OTHER): Payer: Medicaid Other | Admitting: Student

## 2022-09-15 VITALS — BP 119/77 | HR 72 | Ht 63.0 in | Wt 204.2 lb

## 2022-09-15 DIAGNOSIS — Z0000 Encounter for general adult medical examination without abnormal findings: Secondary | ICD-10-CM

## 2022-09-15 NOTE — Patient Instructions (Signed)
It was great to see you! Thank you for allowing me to participate in your care!  I recommend that you always bring your medications to each appointment as this makes it easy to ensure we are on the correct medications and helps [place] not miss when refills are needed.  Our plans for today:  - Annual Exam Your annual check up was completely normal. Follow up in 1 year. And let us know if you need anything in the future.  Take care and seek immediate care sooner if you develop any concerns.   Dr. Bess Kinds, MD [facility]

## 2022-09-15 NOTE — Progress Notes (Signed)
    SUBJECTIVE:   Chief compliant/HPI: annual examination  Susan Stone is a 32 y.o. who presents today for an annual exam.   OBJECTIVE:   BP 119/77   Pulse 72   Ht 5\' 3"  (1.6 m)   Wt 204 lb 4 oz (92.6 kg)   LMP 09/03/2022   SpO2 100%   BMI 36.18 kg/m    Physical Exam Constitutional:      General: She is not in acute distress.    Appearance: Normal appearance. She is not ill-appearing.  Cardiovascular:     Rate and Rhythm: Normal rate and regular rhythm.     Pulses: Normal pulses.     Heart sounds: Normal heart sounds. No murmur heard.    No friction rub. No gallop.  Pulmonary:     Effort: Pulmonary effort is normal. No respiratory distress.     Breath sounds: Normal breath sounds. No stridor. No wheezing, rhonchi or rales.  Abdominal:     General: Bowel sounds are normal. There is no distension.     Palpations: Abdomen is soft. There is no mass.     Tenderness: There is no abdominal tenderness.  Neurological:     Mental Status: She is alert.  Psychiatric:        Mood and Affect: Mood normal.        Behavior: Behavior normal.       ASSESSMENT/PLAN:   Annual physical exam Patient comes in for annual exam.  Patient has no complaints.  Patient's blood pressure at goal, no concern for depression.  Patient declines STI testing as she has not been sexually active since her last testing.  Patient comes in for annual exam, to receive letter for clearance for work.  Patient doing well, no complaints, provider without concerns patient appears healthy.  Letter provided for work. - Follow-up as needed    Annual Examination  See AVS for age appropriate recommendations.   PHQ not done, but negtaive for depressive/anxiety symptoms. Blood pressure reviewed and at goal .  Asked about intimate partner violence and patient reports Not in a relationship.  The patient currently uses nothing for contraception, and is interested in considering in the future. Folate recommended  as appropriate, minimum of 400 mcg per day.    Considered the following items based upon USPSTF recommendations: HIV testing: discussed and declined  Neg 02/26/09 Hepatitis C: discussed and declined  Neg 07/24/21 Hepatitis B: discussed and declined  Neg 02/26/09 Syphilis if at high risk: discussed and declined  Neg 03/12/14 GC/CT  Declined  Neg 02/04/20, patient has not been sexually active since last test. Lipid panel (nonfasting or fasting) discussed based upon AHA recommendations and not ordered.  Consider repeat every 4-6 years.  Reviewed risk factors for latent tuberculosis and not indicated  Discussed family history, BRCA testing not indicated.  Cervical cancer screening: prior Pap reviewed, repeat due in 06/27   Follow up in 1   year or sooner if indicated.    Bess Kinds, MD [facility]

## 2022-09-22 DIAGNOSIS — Z0000 Encounter for general adult medical examination without abnormal findings: Secondary | ICD-10-CM | POA: Insufficient documentation

## 2022-09-22 NOTE — Assessment & Plan Note (Signed)
Patient comes in for annual exam.  Patient has no complaints.  Patient's blood pressure at goal, no concern for depression.  Patient declines STI testing as she has not been sexually active since her last testing.  Patient comes in for annual exam, to receive letter for clearance for work.  Patient doing well, no complaints, provider without concerns patient appears healthy.  Letter provided for work. - Follow-up as needed

## 2023-01-24 ENCOUNTER — Ambulatory Visit: Payer: Medicaid Other

## 2023-03-24 ENCOUNTER — Ambulatory Visit: Payer: Medicaid Other | Admitting: Family Medicine

## 2023-03-24 VITALS — BP 125/88 | HR 77 | Ht 63.0 in | Wt 209.2 lb

## 2023-03-24 DIAGNOSIS — R300 Dysuria: Secondary | ICD-10-CM

## 2023-03-24 LAB — POCT URINE PREGNANCY: Preg Test, Ur: NEGATIVE

## 2023-03-24 LAB — POCT URINALYSIS DIP (MANUAL ENTRY)
Bilirubin, UA: NEGATIVE
Glucose, UA: NEGATIVE mg/dL
Nitrite, UA: NEGATIVE
Protein Ur, POC: >=300 mg/dL — AB
Spec Grav, UA: 1.025 (ref 1.010–1.025)
Urobilinogen, UA: 0.2 U/dL
pH, UA: 6 (ref 5.0–8.0)

## 2023-03-24 LAB — POCT UA - MICROSCOPIC ONLY
Epithelial cells, urine per micros: >20
RBC, Urine, Miroscopic: >20 (ref 0–2)
WBC, Ur, HPF, POC: >20 (ref 0–5)

## 2023-03-24 MED ORDER — TAMSULOSIN HCL 0.4 MG PO CAPS: 0.4000 mg | ORAL_CAPSULE | Freq: Every day | ORAL | 1 refills | Status: AC

## 2023-03-24 NOTE — Patient Instructions (Signed)
Good to see you today - Thank you for coming in  Things we discussed today:  1) Your urinary symptoms are most likely due to a kidney stone. -You can take Flomax once a day for the next 5 days to help relax your urinary tract and allow for the stone to pass and decrease discomfort.  After 5 days, if you are not having any more symptoms, you can stop taking Flomax.  If you are still having discomfort, you can continue taking the Flomax for a total of 10 days. -If your symptoms are still not improving after 10 days, please let our clinic know -I am also sending to get a CT scan to check for the size of the kidney -You can also take Tylenol as needed to help with pain  Come back to see me if your symptoms are not improving after 10 days

## 2023-03-24 NOTE — Progress Notes (Signed)
Urine    SUBJECTIVE:   CHIEF COMPLAINT / HPI:   Susan Stone is a 33yo F w/ hx of anemia that p/f abnormal urinary sensation.  - starting last night, felt pressure when she was trying to pee. Has a sensation of something in her urethra. - Denies burning or pain with peeing, it's more of just "pressure" - Feels increased urinary frequency - No fevers - No back or abm pain - Report light pink color in urine, but no obvious clot - Just finished period on 12/31-1/4. - Denies vaginal discharge or itching.  - Grandparents, mom, aunt, cousins had kidney stones.   OBJECTIVE:   BP 125/88   Pulse 77   Ht 5\' 3"  (1.6 m)   Wt 209 lb 4 oz (94.9 kg)   LMP 03/17/2023   SpO2 100%   BMI 37.07 kg/m   General: Alert, pleasant well-appearing woman. NAD. HEENT: NCAT. MMM.   CV: RRR, no murmurs.  Resp: CTAB, no wheezing or crackles. Normal WOB on RA.  Abm: Soft, nontender, nondistended. BS present.  No CVA tenderness Ext: Moves all ext spontaneously Skin: Warm, well perfused   ASSESSMENT/PLAN:   Assessment & Plan Dysuria Leading differential includes nephrolithiasis vs UTI. Nephrolithiasis is more likely given UA (blood, no bacteria), afeb, family hx.   - Start flomax 0.4mg  daily for next 5 days - f/u CTAP w/o con - f/u Ucx - Tylenol prn for pain -Provided handout with dietary and lifestyle changes to help decrease kidney stone   Lincoln Brigham, MD [facility]

## 2023-03-27 LAB — URINE CULTURE

## 2023-03-29 ENCOUNTER — Telehealth: Payer: Self-pay | Admitting: Family Medicine

## 2023-03-29 DIAGNOSIS — R300 Dysuria: Secondary | ICD-10-CM

## 2023-03-29 MED ORDER — CEFADROXIL 500 MG PO CAPS
500.0000 mg | ORAL_CAPSULE | Freq: Two times a day (BID) | ORAL | 0 refills | Status: AC
Start: 2023-03-29 — End: 2023-04-05

## 2023-03-29 NOTE — Telephone Encounter (Signed)
Called patient to discuss the results of her urine culture.  Still having pressure with peeing but no pain. Also having pain above her belly button. Reports that the pressure with peeing has improved since starting flomax.  Denies fevers, denies blood in urine or clots, denies any stones noted in urine.  Discussed with patient that her urine culture grew E. coli, however this is most likely contamination given high number of epithelial cells on UA.  Given continued symptoms, I offered for patient to start antibiotic versus waiting until her office visit tomorrow with Dr. Barbaraann Faster. Pt refers to start antibiotic course. -Start cefadroxil 500mg  BID for 7 days -ED precautions discussed.  Advised patient to keep her follow-up appointment with PCP tomorrow -Has CT scan scheduled for later this week.

## 2023-03-30 ENCOUNTER — Encounter: Payer: Self-pay | Admitting: Student

## 2023-03-30 ENCOUNTER — Ambulatory Visit: Payer: Medicaid Other | Admitting: Student

## 2023-03-30 VITALS — BP 122/73 | HR 83 | Ht 63.0 in | Wt 211.2 lb

## 2023-03-30 DIAGNOSIS — N3001 Acute cystitis with hematuria: Secondary | ICD-10-CM

## 2023-03-30 DIAGNOSIS — R109 Unspecified abdominal pain: Secondary | ICD-10-CM | POA: Insufficient documentation

## 2023-03-30 DIAGNOSIS — R1013 Epigastric pain: Secondary | ICD-10-CM

## 2023-03-30 DIAGNOSIS — N390 Urinary tract infection, site not specified: Secondary | ICD-10-CM | POA: Insufficient documentation

## 2023-03-30 NOTE — Progress Notes (Signed)
  SUBJECTIVE:   CHIEF COMPLAINT / HPI:   BIRTHDAY   Nephrolithiasis -Seen 1/16 for UTI vs Nephrolithiasis, w/ pressure w/ urination/pain in  stomach, and blood in urine -Tx w/ flomax and tylenol, CTAP pending - Ucx showed E.coli > cefadroxil 500mg  BID for 7 days   Today - Abdominal Pain Notes emesis yesterday, is having BM almost everyday,  but small pellets. Belly ache also noted, that began 2 days ago. Pain comes and goes sporadically and feels like a sharp pain, that comes in waves. Not related to eating. Denies any fevers/body aches.   PERTINENT  PMH / PSH:   No past medical history on file. OBJECTIVE:  BP 122/73   Pulse 83   Ht 5\' 3"  (1.6 m)   Wt 211 lb 3.2 oz (95.8 kg)   LMP 03/17/2023   SpO2 100%   BMI 37.41 kg/m  Physical Exam Abdominal:     General: Abdomen is flat. Bowel sounds are normal. There is no distension. There are no signs of injury.     Palpations: Abdomen is soft. There is no fluid wave, splenomegaly or mass.     Tenderness: There is no abdominal tenderness. There is no guarding. Negative signs include Murphy's sign.      ASSESSMENT/PLAN:   Assessment & Plan Epigastric pain Patient comes in with 2-day history of abdominal pain.  Patient denies any association with eating, but appreciates decreased appetite/feeling full.  Patient denies any systemic symptoms.  Patient appreciates she has not had a full bowel movement in 2 to 3 days, having small pellets when she has stools.  Patient due to get CT abdomen pelvis on 04/01/2023, follow-up kidney stones.  Patient currently being treated for UTI with cefadroxil, antibiotics started yesterday.  Will recommend patient try stool softener, to encourage bowel movement, to see if improvement in pain.  Patient to call clinic and leave message for provider, if she has not had a bowel movement by Sunday, will add stimulant.  Low concern for infection, given lack of systemic symptoms, and patient actively on antibiotics. -  MiraLAX daily, (reviewed titration) - Consider senna if no bowel movement after 4 to 5 days (Sunday) Acute cystitis with hematuria Here for follow-up of UTI, versus nephrolithiasis.  Patient thought to have kidney stones at last appointment on 116, 25, however urine culture came back with E. coli.  Patient was started on cefadroxil yesterday.  Will recommend continue antibiotic course, and follow-up if not improving.  Patient denies any systemic symptoms or flank pain, with benign physical exam.  Patient due to have CT abdomen pelvis on 04/01/2023, will encourage patient to get, and follow-up. - Obtain CT abdomen pelvis - Continue antibiotics No follow-ups on file. Bess Kinds, MD 03/30/2023, 9:23 AM PGY-3, [facility]

## 2023-03-30 NOTE — Assessment & Plan Note (Addendum)
Here for follow-up of UTI, versus nephrolithiasis.  Patient thought to have kidney stones at last appointment on 116, 25, however urine culture came back with E. coli.  Patient was started on cefadroxil yesterday.  Will recommend continue antibiotic course, and follow-up if not improving.  Patient denies any systemic symptoms or flank pain, with benign physical exam.  Patient due to have CT abdomen pelvis on 04/01/2023, will encourage patient to get, and follow-up. - Obtain CT abdomen pelvis - Continue antibiotics

## 2023-03-30 NOTE — Patient Instructions (Signed)
It was great to see you! Thank you for allowing me to participate in your care!  I recommend that you always bring your medications to each appointment as this makes it easy to ensure we are on the correct medications and helps [place] not miss when refills are needed.  Our plans for today:  - Abdominal Pain We are going to try to get you to have a bowel movement, to see if this improves your abdominal pain.  We also have you follow-up and get the CT scan, to see if anything else is going on.   Take MiraLAX daily (stool softener)   1 cap or 1 packet daily x 5 days  *Should have full/long Bowel Movement, not just pellets  IF no bowel movement by Sunday, call clinic and leave me a message and ask for stimulant medication to speed things along.  If having watery bowel movement, back off of the mirlax to every other day or less frequently.   - Urinary Tract Infection Continue antibiotics If symptoms worsen, or do not get better in the next few days, make follow-up appointment. If you develop fevers, chills, back or side pain, go to emergency department right away, you may have a worsening infection.    Take care and seek immediate care sooner if you develop any concerns.   Dr. Bess Kinds, MD [facility]

## 2023-03-30 NOTE — Assessment & Plan Note (Addendum)
Patient comes in with 2-day history of abdominal pain.  Patient denies any association with eating, but appreciates decreased appetite/feeling full.  Patient denies any systemic symptoms.  Patient appreciates she has not had a full bowel movement in 2 to 3 days, having small pellets when she has stools.  Patient due to get CT abdomen pelvis on 04/01/2023, follow-up kidney stones.  Patient currently being treated for UTI with cefadroxil, antibiotics started yesterday.  Will recommend patient try stool softener, to encourage bowel movement, to see if improvement in pain.  Patient to call clinic and leave message for provider, if she has not had a bowel movement by Sunday, will add stimulant.  Low concern for infection, given lack of systemic symptoms, and patient actively on antibiotics. - MiraLAX daily, (reviewed titration) - Consider senna if no bowel movement after 4 to 5 days (Sunday)

## 2023-04-01 ENCOUNTER — Ambulatory Visit (HOSPITAL_COMMUNITY)
Admission: RE | Admit: 2023-04-01 | Discharge: 2023-04-01 | Disposition: A | Discharge status: Home / Self Care | Payer: Medicaid Other | Source: Ambulatory Visit | Attending: Family Medicine

## 2023-04-01 DIAGNOSIS — R300 Dysuria: Secondary | ICD-10-CM | POA: Insufficient documentation

## 2023-04-04 ENCOUNTER — Encounter: Payer: Self-pay | Admitting: Family Medicine

## 2023-05-14 ENCOUNTER — Ambulatory Visit
Admission: RE | Admit: 2023-05-14 | Discharge: 2023-05-14 | Disposition: A | Discharge status: Home / Self Care | Source: Ambulatory Visit | Attending: Family Medicine

## 2023-05-14 VITALS — BP 119/86 | HR 97 | Temp 98.8°F

## 2023-05-14 DIAGNOSIS — L0501 Pilonidal cyst with abscess: Secondary | ICD-10-CM | POA: Diagnosis not present

## 2023-05-14 MED ORDER — HYDROCODONE-ACETAMINOPHEN 5-325 MG PO TABS: 1.0000 | ORAL_TABLET | Freq: Four times a day (QID) | ORAL | 0 refills | Status: DC | PRN

## 2023-05-14 MED ORDER — CEPHALEXIN 500 MG PO CAPS: 500.0000 mg | ORAL_CAPSULE | Freq: Four times a day (QID) | ORAL | 0 refills | Status: AC

## 2023-05-14 MED ORDER — DOXYCYCLINE HYCLATE 100 MG PO CAPS
100.0000 mg | ORAL_CAPSULE | Freq: Two times a day (BID) | ORAL | 0 refills | Status: DC
Start: 2023-05-14 — End: 2023-12-05

## 2023-05-14 MED ORDER — KETOROLAC TROMETHAMINE 30 MG/ML IJ SOLN
30.0000 mg | Freq: Once | INTRAMUSCULAR | Status: AC
Start: 2023-05-14 — End: 2023-05-14
  Administered 2023-05-14: 30 mg via INTRAMUSCULAR

## 2023-05-14 NOTE — Discharge Instructions (Signed)
 Start doxycycline twice daily for 7 days and Keflex 4 times a day for 7 days.  You may take Norco every 6 hours as needed for pain.  Please note this medication contains Tylenol.  Do not take any additional Tylenol while you are taking this medication.You were given a Toradol injection in clinic today. Do not take any over the counter NSAID's such as Advil, ibuprofen, Aleve, or naproxen for 24 hours.  Do warm compresses frequently to the area as this will help encourage drainage.  Follow-up with your PCP in 2 to 3 days for recheck.  If symptoms do not improve you may need to see general surgery for further treatment.  Please go to the ER if you develop any worsening symptoms.  Hope you feel better soon!

## 2023-05-14 NOTE — ED Provider Notes (Signed)
 [facility]    CSN: [id number] Arrival date & time: 05/14/23  1021      History   Chief Complaint Chief Complaint  Patient presents with   Abscess   Vomiting   Diarrhea    HPI Susan Stone is a 33 y.o. female presents for abscess.  Patient reports yesterday she had some vomiting and diarrhea that is since resolved.  States later that night/this morning she had some pain and swelling to her tailbone area that has persisted.  States she has pain with sitting or palpation.  No active drainage, fevers or chills.  Does report a remote history of abscesses in the past but denies history of MRSA.  No OTC medications have been used since onset.  No other concerns at this time.   Abscess Diarrhea   History reviewed. No pertinent past medical history.  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 Authorizing Provider  cephALEXin (KEFLEX) 500 MG capsule Take 1 capsule (500 mg total) by mouth 4 (four) times daily for 7 days. 05/14/23 05/21/23 Yes Radford Pax, NP  doxycycline (VIBRAMYCIN) 100 MG capsule Take 1 capsule (100 mg total) by mouth 2 (two) times daily. 05/14/23  Yes Radford Pax, NP  HYDROcodone-acetaminophen (NORCO/VICODIN) 5-325 MG tablet Take 1 tablet by mouth every 6 (six) hours as needed for moderate pain (pain score 4-6) or severe  pain (pain score 7-10). 05/14/23  Yes Radford Pax, NP  cetirizine (ZYRTEC) 10 MG tablet Take 10 mg by mouth daily.    [provider]  lidocaine (XYLOCAINE) 2 % solution Use as directed 15 mLs in the mouth or throat every 6 (six) hours as needed for mouth pain. 08/11/21   Haskel Schroeder, PA-C    Family History Family History  Problem Relation Age of Onset   Hypertension Maternal Grandmother    Healthy Mother    Healthy Father     Social History Social History   Tobacco Use   Smoking status: Never   Smokeless tobacco: Never  Vaping Use   Vaping status: Never Used  Substance Use Topics   Alcohol use: No   Drug use: No     Allergies   Tramadol and Amoxicillin   Review of Systems Review of Systems  Skin:  Positive for wound.     Physical Exam Triage Vital Signs ED Triage Vitals  Encounter Vitals Group     BP 05/14/23 1048 119/86     Systolic BP Percentile --      Diastolic BP Percentile --  Pulse Rate 05/14/23 1048 97     Resp --      Temp 05/14/23 1048 98.8 F (37.1 C)     Temp Source 05/14/23 1048 Oral     SpO2 05/14/23 1048 97 %     Weight --      Height --      Head Circumference --      Peak Flow --      Pain Score 05/14/23 1046 10     Pain Loc --      Pain Education --      Exclude from Growth Chart --    No data found.  Updated Vital Signs BP 119/86 (BP Location: Left Arm)   Pulse 97   Temp 98.8 F (37.1 C) (Oral)   LMP 05/02/2023 (Exact Date)   SpO2 97%   Visual Acuity Right Eye Distance:   Left Eye Distance:   Bilateral Distance:    Right Eye Near:   Left Eye Near:    Bilateral Near:     Physical Exam Vitals and nursing note reviewed.  Constitutional:      Appearance: Normal appearance.  HENT:     Head: Normocephalic and atraumatic.  Eyes:     Pupils: Pupils are equal, round, and reactive to light.  Cardiovascular:     Rate and Rhythm: Normal rate.  Pulmonary:     Effort: Pulmonary effort is normal.   Skin:    General: Skin is warm and dry.          Comments: There is a fluctuant not indurated tender pilonidal abscess that extends slightly to right buttock.  No erythema or warmth.  No active drainage.  Neurological:     General: No focal deficit present.     Mental Status: She is alert and oriented to person, place, and time.  Psychiatric:        Mood and Affect: Mood normal.        Behavior: Behavior normal.      UC Treatments / Results  Labs (all labs ordered are listed, but only abnormal results are displayed) Labs Reviewed - No data to display  EKG   Radiology No results found.  Procedures Procedures (including critical care time)  Medications Ordered in UC Medications  ketorolac (TORADOL) 30 MG/ML injection 30 mg (30 mg Intramuscular Given 05/14/23 1142)    Initial Impression / Assessment and Plan / UC Course  I have reviewed the triage vital signs and the nursing notes.  Pertinent labs & imaging results that were available during my care of the patient were reviewed by me and considered in my medical decision making (see chart for details).     I reviewed exam and symptoms with patient.  No red flags.  She is afebrile and nontoxic in appearance.  Discussed pilonidal abscess.  Area is not indurated and no indication for I&D at this time.  Will do Keflex and doxycycline and encouraged warm compresses.  She was given Toradol injection in clinic for pain, she was monitored for 10 minutes after injection with no reaction noted and tolerated well.  She was given Toradol injection in clinic for pain, she was monitored for 10 minutes after injection with no reaction noted and tolerated well.  She was instructed no NSAIDs for 24 hours and verbalized understanding.  Will do Rx Norco as needed pain.  Advised PCP follow-up 2 to 3 days for recheck.  Instructed if no improvement may need general surgery consult for further  treatment.  ER precautions reviewed and patient verbalized  understanding. Final Clinical Impressions(s) / UC Diagnoses   Final diagnoses:  Pilonidal abscess     Discharge Instructions      Start doxycycline twice daily for 7 days and Keflex 4 times a day for 7 days.  You may take Norco every 6 hours as needed for pain.  Please note this medication contains Tylenol.  Do not take any additional Tylenol while you are taking this medication.You were given a Toradol injection in clinic today. Do not take any over the counter NSAID's such as Advil, ibuprofen, Aleve, or naproxen for 24 hours.  Do warm compresses frequently to the area as this will help encourage drainage.  Follow-up with your PCP in 2 to 3 days for recheck.  If symptoms do not improve you may need to see general surgery for further treatment.  Please go to the ER if you develop any worsening symptoms.  Hope you feel better soon!      ED Prescriptions     Medication Sig Dispense Auth. Provider   doxycycline (VIBRAMYCIN) 100 MG capsule Take 1 capsule (100 mg total) by mouth 2 (two) times daily. 20 capsule Radford Pax, NP   cephALEXin (KEFLEX) 500 MG capsule Take 1 capsule (500 mg total) by mouth 4 (four) times daily for 7 days. 28 capsule Radford Pax, NP   HYDROcodone-acetaminophen (NORCO/VICODIN) 5-325 MG tablet Take 1 tablet by mouth every 6 (six) hours as needed for moderate pain (pain score 4-6) or severe pain (pain score 7-10). 10 tablet Radford Pax, NP      I have reviewed the PDMP during this encounter.   Radford Pax, NP 05/14/23 1144

## 2023-05-14 NOTE — ED Triage Notes (Addendum)
 Pt reports vomiting and diarrhea since Friday morning. Pt states she has pain near her tailbone area. Pt states it hurts to sit down and lay down.

## 2023-05-16 ENCOUNTER — Emergency Department (HOSPITAL_BASED_OUTPATIENT_CLINIC_OR_DEPARTMENT_OTHER)
Admission: EM | Admit: 2023-05-16 | Discharge: 2023-05-16 | Disposition: A | Discharge status: Home / Self Care | Attending: Emergency Medicine | Admitting: Emergency Medicine

## 2023-05-16 ENCOUNTER — Encounter (HOSPITAL_BASED_OUTPATIENT_CLINIC_OR_DEPARTMENT_OTHER): Payer: Self-pay | Admitting: Emergency Medicine

## 2023-05-16 ENCOUNTER — Other Ambulatory Visit: Payer: Self-pay

## 2023-05-16 DIAGNOSIS — L03317 Cellulitis of buttock: Secondary | ICD-10-CM | POA: Diagnosis not present

## 2023-05-16 DIAGNOSIS — L0591 Pilonidal cyst without abscess: Secondary | ICD-10-CM | POA: Diagnosis present

## 2023-05-16 LAB — PREGNANCY, URINE: Preg Test, Ur: NEGATIVE

## 2023-05-16 LAB — CBC WITH DIFFERENTIAL/PLATELET
Abs Immature Granulocytes: 0.05 10*3/uL (ref 0.00–0.07)
Basophils Absolute: 0 10*3/uL (ref 0.0–0.1)
Basophils Relative: 0 %
Eosinophils Absolute: 0 10*3/uL (ref 0.0–0.5)
Eosinophils Relative: 0 %
HCT: 34.4 % — ABNORMAL LOW (ref 36.0–46.0)
Hemoglobin: 10.6 g/dL — ABNORMAL LOW (ref 12.0–15.0)
Immature Granulocytes: 1 %
Lymphocytes Relative: 16 %
Lymphs Abs: 1.7 10*3/uL (ref 0.7–4.0)
MCH: 23.6 pg — ABNORMAL LOW (ref 26.0–34.0)
MCHC: 30.8 g/dL (ref 30.0–36.0)
MCV: 76.4 fL — ABNORMAL LOW (ref 80.0–100.0)
Monocytes Absolute: 0.6 10*3/uL (ref 0.1–1.0)
Monocytes Relative: 6 %
Neutro Abs: 8 10*3/uL — ABNORMAL HIGH (ref 1.7–7.7)
Neutrophils Relative %: 77 %
Platelets: 378 10*3/uL (ref 150–400)
RBC: 4.5 MIL/uL (ref 3.87–5.11)
RDW: 15.9 % — ABNORMAL HIGH (ref 11.5–15.5)
WBC: 10.4 10*3/uL (ref 4.0–10.5)
nRBC: 0 % (ref 0.0–0.2)

## 2023-05-16 LAB — URINALYSIS, W/ REFLEX TO CULTURE (INFECTION SUSPECTED)
Bilirubin Urine: NEGATIVE
Glucose, UA: NEGATIVE mg/dL
Hgb urine dipstick: NEGATIVE
Ketones, ur: NEGATIVE mg/dL
Leukocytes,Ua: NEGATIVE
Nitrite: NEGATIVE
Protein, ur: 30 mg/dL — AB
Specific Gravity, Urine: >=1.03 (ref 1.005–1.030)
WBC, UA: NONE SEEN WBC/hpf (ref 0–5)
pH: 6 (ref 5.0–8.0)

## 2023-05-16 LAB — COMPREHENSIVE METABOLIC PANEL
ALT: 15 U/L (ref 0–44)
AST: 17 U/L (ref 15–41)
Albumin: 4.3 g/dL (ref 3.5–5.0)
Alkaline Phosphatase: 55 U/L (ref 38–126)
Anion gap: 11 (ref 5–15)
BUN: 8 mg/dL (ref 6–20)
CO2: 24 mmol/L (ref 22–32)
Calcium: 9.4 mg/dL (ref 8.9–10.3)
Chloride: 98 mmol/L (ref 98–111)
Creatinine, Ser: 0.69 mg/dL (ref 0.44–1.00)
GFR, Estimated: >60 mL/min (ref >60)
Glucose, Bld: 105 mg/dL — ABNORMAL HIGH (ref 70–99)
Potassium: 3.8 mmol/L (ref 3.5–5.1)
Sodium: 133 mmol/L — ABNORMAL LOW (ref 135–145)
Total Bilirubin: 1.1 mg/dL (ref 0.0–1.2)
Total Protein: 8.2 g/dL — ABNORMAL HIGH (ref 6.5–8.1)

## 2023-05-16 LAB — LACTIC ACID, PLASMA: Lactic Acid, Venous: 1 mmol/L (ref 0.5–1.9)

## 2023-05-16 MED ORDER — LIDOCAINE-EPINEPHRINE (PF) 2 %-1:200000 IJ SOLN
10.0000 mL | Freq: Once | INTRAMUSCULAR | Status: AC
  Administered 2023-05-16: 10 mL via INTRADERMAL
  Filled 2023-05-16: qty 20

## 2023-05-16 MED ORDER — SULFAMETHOXAZOLE-TRIMETHOPRIM 800-160 MG PO TABS: 1.0000 | ORAL_TABLET | Freq: Two times a day (BID) | ORAL | 0 refills | Status: AC

## 2023-05-16 MED ORDER — OXYCODONE-ACETAMINOPHEN 5-325 MG PO TABS
1.0000 | ORAL_TABLET | Freq: Once | ORAL | Status: AC
  Administered 2023-05-16: 1 via ORAL
  Filled 2023-05-16: qty 1

## 2023-05-16 MED ORDER — SULFAMETHOXAZOLE-TRIMETHOPRIM 800-160 MG PO TABS
1.0000 | ORAL_TABLET | Freq: Once | ORAL | Status: AC
Start: 2023-05-16 — End: 2023-05-16
  Administered 2023-05-16: 1 via ORAL
  Filled 2023-05-16: qty 1

## 2023-05-16 NOTE — ED Notes (Signed)
 Pt placed in a gown and I & D tray at bedside

## 2023-05-16 NOTE — ED Triage Notes (Signed)
 Patient coming to ED for evaluation of abscess/cyst to buttock/tailbone.  Seen at [facility]gent [facility] Friday and dx with pilonidal abscess.  Started on Keflex and Doxycycline.  Reports "it has become softer, but I am now hurting all over.  It is wrapping around to my abdomen.  I can't sleep or sit down."  No reports of current fever.

## 2023-05-16 NOTE — ED Provider Notes (Signed)
 [place] [facility] Provider Note   CSN: [id number] Arrival date & time: 05/16/23  1925     History Chief Complaint  Patient presents with   Cyst    Susan Stone is a 33 y.o. female.  Patient without significant medical history presents to the emergency department concerns of a cyst.  Patient went to the urgent care 2 days ago for concerns of a pilonidal cyst and was discharged home with Keflex and doxycycline.  She reports that the area has become a little bit softer but is having worsening pain.  Denies recent fever chills or bodyaches.  HPI     Home Medications Prior to Admission medications   Medication Sig Start Date End Date Taking? Authorizing Provider  sulfamethoxazole-trimethoprim (BACTRIM DS) 800-160 MG tablet Take 1 tablet by mouth 2 (two) times daily for 7 days. 05/16/23 05/23/23 Yes Smitty Knudsen, PA-C  cephALEXin (KEFLEX) 500 MG capsule Take 1 capsule (500 mg total) by mouth 4 (four) times daily for 7 days. 05/14/23 05/21/23  Radford Pax, NP  cetirizine (ZYRTEC) 10 MG tablet Take 10 mg by mouth daily.    [provider]  doxycycline (VIBRAMYCIN) 100 MG capsule Take 1 capsule (100 mg total) by mouth 2 (two) times daily. 05/14/23   Radford Pax, NP  HYDROcodone-acetaminophen (NORCO/VICODIN) 5-325 MG tablet Take 1 tablet by mouth every 6 (six) hours as needed for moderate pain (pain score 4-6) or severe pain (pain score 7-10). 05/14/23   Radford Pax, NP  lidocaine (XYLOCAINE) 2 % solution Use as directed 15 mLs in the mouth or throat every 6 (six) hours as needed for mouth pain. 08/11/21   Haskel Schroeder, PA-C      Allergies    Tramadol and Amoxicillin    Review of Systems   Review of Systems  Skin:        Cyst  All other systems reviewed and are negative.   Physical Exam Updated Vital Signs BP 117/84   Pulse 91   Temp 98.4 F (36.9 C) (Oral)   Resp 15   Ht 5\' 3"  (1.6 m)   Wt 95.7 kg   LMP 05/02/2023  (Exact Date)   SpO2 100%   BMI 37.38 kg/m  Physical Exam Vitals and nursing note reviewed.  Constitutional:      General: She is not in acute distress.    Appearance: She is well-developed.  HENT:     Head: Normocephalic and atraumatic.  Eyes:     Conjunctiva/sclera: Conjunctivae normal.  Cardiovascular:     Rate and Rhythm: Normal rate and regular rhythm.     Heart sounds: No murmur heard. Pulmonary:     Effort: Pulmonary effort is normal. No respiratory distress.     Breath sounds: Normal breath sounds.  Abdominal:     Palpations: Abdomen is soft.     Tenderness: There is no abdominal tenderness.  Musculoskeletal:        General: No swelling.     Cervical back: Neck supple.  Skin:    General: Skin is warm and dry.     Capillary Refill: Capillary refill takes less than 2 seconds.          Comments: Pilonidal cyst with predominance towards the right side.  Significant fluctuance with surrounding erythema and skin induration concerning for cellulitis.  Neurological:     Mental Status: She is alert.  Psychiatric:        Mood and  Affect: Mood normal.     ED Results / Procedures / Treatments   Labs (all labs ordered are listed, but only abnormal results are displayed) Labs Reviewed  COMPREHENSIVE METABOLIC PANEL - Abnormal; Notable for the following components:      Result Value   Sodium 133 (*)    Glucose, Bld 105 (*)    Total Protein 8.2 (*)    All other components within normal limits  CBC WITH DIFFERENTIAL/PLATELET - Abnormal; Notable for the following components:   Hemoglobin 10.6 (*)    HCT 34.4 (*)    MCV 76.4 (*)    MCH 23.6 (*)    RDW 15.9 (*)    Neutro Abs 8.0 (*)    All other components within normal limits  URINALYSIS, W/ REFLEX TO CULTURE (INFECTION SUSPECTED) - Abnormal; Notable for the following components:   Protein, ur 30 (*)    Bacteria, UA RARE (*)    All other components within normal limits  LACTIC ACID, PLASMA  PREGNANCY, URINE     EKG None  Radiology No results found.  Procedures .Incision and Drainage  Date/Time: 05/16/2023 11:23 PM  Performed by: Smitty Knudsen, PA-C Authorized by: Smitty Knudsen, PA-C   Consent:    Consent obtained:  Verbal   Consent given by:  Patient   Risks discussed:  Bleeding and incomplete drainage   Alternatives discussed:  Referral and delayed treatment Universal protocol:    Patient identity confirmed:  Verbally with patient and arm band Location:    Type:  Pilonidal cyst   Size:  10cm   Location:  Anogenital   Anogenital location:  Pilonidal Pre-procedure details:    Skin preparation:  Povidone-iodine Sedation:    Sedation type:  None Anesthesia:    Anesthesia method:  Local infiltration   Local anesthetic:  Lidocaine 2% WITH epi Procedure type:    Complexity:  Complex Procedure details:    Ultrasound guidance: no     Needle aspiration: no     Incision types:  Single straight   Incision depth:  Dermal   Wound management:  Probed and deloculated, irrigated with saline, extensive cleaning and debrided   Drainage:  Purulent and serosanguinous   Drainage amount:  Copious   Wound treatment:  Wound left open   Packing materials:  1/4 in iodoform gauze   Amount 1/4" iodoform:  5in Post-procedure details:    Procedure completion:  Tolerated     Medications Ordered in ED Medications  oxyCODONE-acetaminophen (PERCOCET/ROXICET) 5-325 MG per tablet 1 tablet (1 tablet Oral Given 05/16/23 2159)  lidocaine-EPINEPHrine (XYLOCAINE W/EPI) 2 %-1:200000 (PF) injection 10 mL (10 mLs Intradermal Given by Other 05/16/23 2200)  sulfamethoxazole-trimethoprim (BACTRIM DS) 800-160 MG per tablet 1 tablet (1 tablet Oral Given 05/16/23 2257)    ED Course/ Medical Decision Making/ A&P                                Medical Decision Making Amount and/or Complexity of Data Reviewed Labs: ordered.  Risk Prescription drug management.   This patient presents to the ED for  concern of cyst.  Differential diagnosis includes bilateral cyst, gluteal cleft abscess, cellulitis   Lab Tests:  I Ordered, and personally interpreted labs.  The pertinent results include: CBC unremarkable, CMP unremarkable, lactic acid normal at 1.0, urine pregnancy negative, UA unremarkable   Medicines ordered and prescription drug management:  I ordered medication including Percocet, lidocaine with  epinephrine, Bactrim DS for pain, anesthetic, cellulitis Reevaluation of the patient after these medicines showed that the patient improved I have reviewed the patients home medicines and have made adjustments as needed   Problem List / ED Course:  Patient presents to the emergency department concerns of a cyst.  States has been ongoing for several days was seen urgent care 2 days ago.  Was started on Keflex and doxycycline without improvement in symptoms.  She is concerned that the pain is worsened.  Has not had any drainage.  No prior history of cysts or abscesses in this area. On exam, there is an area of fluctuance with a surrounding ring of of induration with mild erythema present.  Concern for pilonidal cyst.  Given clinical suspicion, offered incision and drainage and patient consented to. Incision and drainage successfully performed with copious amounts of purulent drainage removed.  Patient reports improvement in symptoms.  Given surrounding cellulitis, will start patient on Bactrim DS.  Advised patient take this as prescribed for the neck 7 days.  No other acute or concerning abnormalities at this time.  Patient discharged home otherwise stable condition with plans for outpatient follow-up.  Final Clinical Impression(s) / ED Diagnoses Final diagnoses:  Pilonidal cyst  Cellulitis of buttock    Rx / DC Orders ED Discharge Orders          Ordered    sulfamethoxazole-trimethoprim (BACTRIM DS) 800-160 MG tablet  2 times daily        05/16/23 2251              Smitty Knudsen, PA-C 05/16/23 2324    Tegeler, Canary Brim, MD 05/17/23 0000

## 2023-05-16 NOTE — Discharge Instructions (Signed)
 You were seen in the emergency department today for concerns of a cyst.  You have a pilonidal cyst.  This was incised and drained here in the emergency department.  Your labs are otherwise unremarkable.  A small piece of gauze was placed into the incision site to keep the area open to allow for continued drainage.  You were switched to Bactrim for antibiotic coverage for your cellulitis.  Please take this as prescribed and discontinue the use of your doxycycline or cephalexin.  Please follow-up with your primary care provider or have this wound rechecked in the next 2 to 3 days for removal of the gauze to ensure that the wound is healing appropriately.

## 2023-05-16 NOTE — ED Notes (Signed)
 D/c paperwork reviewed with pt, including prescriptions and follow up care.  All questions and/or concerns addressed at time of d/c.  No further needs expressed. . Pt verbalized understanding, Ambulatory without assistance to meet ride at ED exit, NAD.

## 2023-05-23 ENCOUNTER — Ambulatory Visit: Admitting: Student

## 2023-05-23 ENCOUNTER — Encounter: Payer: Self-pay | Admitting: Student

## 2023-05-23 ENCOUNTER — Other Ambulatory Visit (HOSPITAL_COMMUNITY)
Admission: RE | Admit: 2023-05-23 | Discharge: 2023-05-23 | Disposition: A | Discharge status: Home / Self Care | Source: Ambulatory Visit | Attending: Family Medicine | Admitting: Family Medicine

## 2023-05-23 VITALS — BP 121/70 | HR 95 | Ht 63.0 in | Wt 206.6 lb

## 2023-05-23 DIAGNOSIS — Z113 Encounter for screening for infections with a predominantly sexual mode of transmission: Secondary | ICD-10-CM | POA: Diagnosis present

## 2023-05-23 DIAGNOSIS — Z124 Encounter for screening for malignant neoplasm of cervix: Secondary | ICD-10-CM

## 2023-05-23 NOTE — Progress Notes (Signed)
  SUBJECTIVE:   CHIEF COMPLAINT / HPI:   Pilonidal Cyst -05/16/23 had I/D at ED and started on bactrim x 7 days  Yeast Infection  Today: Reports cyst has resolved but is having concerns of yeast infection from ABX. Patient reports symptoms started about 2 days ago with itching and vaginal discharge. Patient denies using any new body washes or soaps, but thinks symptoms came from recent course of abx, completed today. Pt sexually active w/ one partner and uses protection inconsistently.   PERTINENT  PMH / PSH:   No past medical history on file. OBJECTIVE:  BP 121/70   Pulse 95   Ht 5\' 3"  (1.6 m)   Wt 206 lb 9.6 oz (93.7 kg)   LMP 05/02/2023 (Exact Date)   SpO2 100%   BMI 36.60 kg/m  Physical Exam Exam conducted with a chaperone present.  Constitutional:      General: She is not in acute distress.    Appearance: Normal appearance. She is not ill-appearing.  Genitourinary:    General: Normal vulva.     Labia:        Right: No rash, tenderness, lesion or injury.        Left: No rash, tenderness, lesion or injury.      Vagina: No signs of injury. No vaginal discharge, erythema, tenderness, bleeding or lesions.     Cervix: Normal and dilated. No cervical motion tenderness, discharge, friability, lesion, erythema or cervical bleeding.     Comments: Gluteal cleft clear w/ healed lesion Neurological:     Mental Status: She is alert.      ASSESSMENT/PLAN:   Assessment & Plan Screening examination for STI Patient comes in for routine screening of STIs.  Patient reports that she has had vaginal discharge, and irritation for the last 2 days.  Patient recently took antibiotics course for a pilonidal cyst, complete antibiotics today.  Patient reports symptoms began 2 days ago, likely concerning for yeast infection. Pt also sexually active thus will screen for STIs.  Pelvic exam benign, also collected Pap smear today.  - HIV, RPR, GC/CT, BV, yeast, Trich - Pap Smear collected No  follow-ups on file. Bess Kinds, MD 05/23/2023, 5:00 PM PGY-3, [facility]

## 2023-05-23 NOTE — Assessment & Plan Note (Addendum)
 Patient comes in for routine screening of STIs.  Patient reports that she has had vaginal discharge, and irritation for the last 2 days.  Patient recently took antibiotics course for a pilonidal cyst, complete antibiotics today.  Patient reports symptoms began 2 days ago, likely concerning for yeast infection. Pt also sexually active thus will screen for STIs.  Pelvic exam benign, also collected Pap smear today.  - HIV, RPR, GC/CT, BV, yeast, Trich - Pap Smear collected

## 2023-05-23 NOTE — Patient Instructions (Signed)
 It was great to see you! Thank you for allowing me to participate in your care!  I recommend that you always bring your medications to each appointment as this makes it easy to ensure we are on the correct medications and helps [place] not miss when refills are needed.  Our plans for today:  - Pilonidal Cyst  Looks good / healed  - Vaginal Discharge Testing you for Sexually Transmitted Infections today, will call with results or send letter if all normal  - PAP Smear  Collected Pap Smear today, will call with results or send letter.   We are checking some labs today, I will call you if they are abnormal will send you a MyChart message or a letter if they are normal.  If you do not hear about your labs in the next 2 weeks please let us know.  Take care and seek immediate care sooner if you develop any concerns.   Dr. Bess Kinds, MD [facility]

## 2023-05-24 LAB — RPR: RPR Ser Ql: NONREACTIVE

## 2023-05-24 LAB — HIV ANTIBODY (ROUTINE TESTING W REFLEX): HIV Screen 4th Generation wRfx: NONREACTIVE

## 2023-05-27 LAB — CYTOLOGY - PAP
Comment: NEGATIVE
Comment: NEGATIVE
Comment: NEGATIVE
Comment: NORMAL
Diagnosis: NEGATIVE
Diagnosis: NEGATIVE
High risk HPV: NEGATIVE
Trichomonas: NEGATIVE

## 2023-05-29 ENCOUNTER — Other Ambulatory Visit: Payer: Self-pay | Admitting: Student

## 2023-05-29 ENCOUNTER — Telehealth: Payer: Self-pay | Admitting: Student

## 2023-05-29 MED ORDER — FLUCONAZOLE 150 MG PO TABS: 150.0000 mg | ORAL_TABLET | Freq: Once | ORAL | 0 refills | Status: AC

## 2023-05-29 NOTE — Telephone Encounter (Signed)
 Called to discuss results with patient and that they showed she had some yeast. Given she was having symptoms of vaginal discharge, will treat her for a yeast infection. Patient notes that she started her cycle yesterday. I told patient to take one dose of the medicine diflucan and to let me know if things didn't clear up by 3 days later. If that was the case, I would send a second dose!

## 2023-08-16 ENCOUNTER — Encounter: Payer: Self-pay | Admitting: *Deleted

## 2023-10-21 ENCOUNTER — Encounter: Admitting: Family Medicine

## 2023-12-05 ENCOUNTER — Ambulatory Visit: Admitting: Family Medicine

## 2023-12-05 ENCOUNTER — Encounter: Payer: Self-pay | Admitting: Family Medicine

## 2023-12-05 VITALS — BP 111/74 | HR 74 | Ht 63.0 in | Wt 213.2 lb

## 2023-12-05 DIAGNOSIS — Z0000 Encounter for general adult medical examination without abnormal findings: Secondary | ICD-10-CM

## 2023-12-05 NOTE — Progress Notes (Signed)
    SUBJECTIVE:   Chief compliant/HPI: annual examination  Susan Stone is a 33 y.o. who presents today for an annual exam.   Review of systems form notable for none.   Updated history tabs and problem list  OBJECTIVE:   BP 111/74   Pulse 74   Ht 5' 3 (1.6 m)   Wt 213 lb 3.2 oz (96.7 kg)   LMP 12/01/2023   SpO2 100%   BMI 37.77 kg/m    - General: No acute distress. Awake and conversant. - Eyes: Normal conjunctiva, anicteric. Round symmetric pupils. - ENT: Hearing grossly intact. No nasal discharge. - Neck: Neck is supple. No masses or thyromegaly. - Respiratory: Respirations are non-labored. - Skin: No visible rashes or ulcers. - Psych: Alert and oriented. Cooperative, Appropriate mood and affect, Normal judgment. - MSK: Normal ambulation. - Neuro: Sensation and CN II-XII grossly normal.  ASSESSMENT/PLAN:   Assessment & Plan Encounter for annual physical exam No concerns today. Discussed contraception as below Annual Examination  See AVS for age appropriate recommendations.   PHQ score 0, reviewed and discussed. Blood pressure reviewed and at goal.  Asked about intimate partner violence and patient reports none, feels safe.  The patient currently uses nothing for contraception. Folate recommended as appropriate, minimum of 400 mcg per day. Does not desire more children. Discussed birth control options and condom use. Patient to consider options and schedule f/u appt if desired Advanced directives not done   Considered the following items based upon USPSTF recommendations: HIV testing:recently completed and result reviewed, normal  Hepatitis C: recently completed and result reviewed, normal  Hepatitis B:recently completed and result reviewed, normal  Syphilis if at high risk: recently completed and result reviewed, normal  GC/CT done recently, negative Lipid panel (nonfasting or fasting) discussed based upon AHA recommendations and not indicated today.   Consider repeat every 4-6 years.  Reviewed risk factors for latent tuberculosis and completed at patient's work.  Discussed family history, BRCA testing not indicated. Tool used to risk stratify was none Cervical cancer screening: prior Pap reviewed, repeat due in 2030 Immunizations: flu shot given at work a few weeks ago MyChart Activation:Already signed up   Follow up in 1 year or sooner if indicated.    Rea Raring, MD [facility]

## 2023-12-05 NOTE — Patient Instructions (Addendum)
 Thank you for coming in today! Here is a summary of what we discussed:  -I recommend visiting the [facility]raception website to explore different options. We can provide most all forms of contraception in our office. Please let me know if you have questions- you can schedule an appointment to discuss if you'd like.  [email or web address]  Please call the clinic at [phone] if your symptoms worsen or you have any concerns.  Best, Dr Adele

## 2024-02-24 ENCOUNTER — Ambulatory Visit: Payer: Self-pay

## 2024-02-24 DIAGNOSIS — Z111 Encounter for screening for respiratory tuberculosis: Secondary | ICD-10-CM

## 2024-02-24 NOTE — Progress Notes (Signed)
 Patient presents to nurse clinic for TB test. Patient is unable to return to clinic on Monday morning for PPD read due to work schedule.   Received verbal orders from Dr. Donah for [facility] TB blood test.   Assisted patient to lab for lab draw.   Susan JAYSON English, RN

## 2024-02-27 ENCOUNTER — Ambulatory Visit: Payer: Self-pay

## 2024-02-28 ENCOUNTER — Ambulatory Visit: Payer: Self-pay | Admitting: Family Medicine

## 2024-02-28 LAB — QUANTIFERON-TB GOLD PLUS
QuantiFERON Mitogen Value: >10 [IU]/mL
QuantiFERON Nil Value: 0.23 [IU]/mL
QuantiFERON TB1 Ag Value: 0.15 [IU]/mL
QuantiFERON TB2 Ag Value: 0.17 [IU]/mL
